# Patient Record
Sex: Female | Born: 1955 | Race: White | Hispanic: No | Smoking: Never smoker
Health system: Southern US, Community
[De-identification: ages and names within clinical notes are randomized; demographics above are authoritative.]

## PROBLEM LIST (undated history)

## (undated) DIAGNOSIS — M1990 Unspecified osteoarthritis, unspecified site: Secondary | ICD-10-CM

## (undated) DIAGNOSIS — L719 Rosacea, unspecified: Secondary | ICD-10-CM

## (undated) DIAGNOSIS — I10 Essential (primary) hypertension: Secondary | ICD-10-CM

## (undated) DIAGNOSIS — G4730 Sleep apnea, unspecified: Secondary | ICD-10-CM

## (undated) DIAGNOSIS — Z9889 Other specified postprocedural states: Secondary | ICD-10-CM

## (undated) DIAGNOSIS — R112 Nausea with vomiting, unspecified: Secondary | ICD-10-CM

## (undated) DIAGNOSIS — E669 Obesity, unspecified: Secondary | ICD-10-CM

## (undated) DIAGNOSIS — R000 Tachycardia, unspecified: Secondary | ICD-10-CM

## (undated) DIAGNOSIS — G4733 Obstructive sleep apnea (adult) (pediatric): Secondary | ICD-10-CM

## (undated) DIAGNOSIS — N950 Postmenopausal bleeding: Secondary | ICD-10-CM

## (undated) DIAGNOSIS — D649 Anemia, unspecified: Secondary | ICD-10-CM

## (undated) HISTORY — DX: Obstructive sleep apnea (adult) (pediatric): G47.33

## (undated) HISTORY — PX: COLONOSCOPY: SHX174

## (undated) HISTORY — PX: WISDOM TOOTH EXTRACTION: SHX21

## (undated) HISTORY — DX: Tachycardia, unspecified: R00.0

## (undated) HISTORY — DX: Postmenopausal bleeding: N95.0

## (undated) HISTORY — DX: Obesity, unspecified: E66.9

## (undated) HISTORY — PX: FOOT SURGERY: SHX648

## (undated) HISTORY — DX: Unspecified osteoarthritis, unspecified site: M19.90

---

## 2007-08-17 HISTORY — PX: CHOLECYSTECTOMY: SHX55

## 2012-06-02 ENCOUNTER — Other Ambulatory Visit (HOSPITAL_COMMUNITY)
Admission: RE | Admit: 2012-06-02 | Discharge: 2012-06-02 | Disposition: A | Discharge status: Home / Self Care | Payer: 59 | Source: Ambulatory Visit | Attending: Family Medicine | Admitting: Family Medicine

## 2012-06-02 DIAGNOSIS — Z124 Encounter for screening for malignant neoplasm of cervix: Secondary | ICD-10-CM | POA: Insufficient documentation

## 2012-06-02 DIAGNOSIS — Z1151 Encounter for screening for human papillomavirus (HPV): Secondary | ICD-10-CM | POA: Insufficient documentation

## 2012-06-19 ENCOUNTER — Other Ambulatory Visit: Payer: Self-pay | Admitting: Family Medicine

## 2012-06-19 DIAGNOSIS — Z1231 Encounter for screening mammogram for malignant neoplasm of breast: Secondary | ICD-10-CM

## 2012-07-24 ENCOUNTER — Ambulatory Visit
Admission: RE | Admit: 2012-07-24 | Discharge: 2012-07-24 | Disposition: A | Discharge status: Home / Self Care | Payer: 59 | Source: Ambulatory Visit | Attending: Family Medicine | Admitting: Family Medicine

## 2012-07-24 DIAGNOSIS — Z1231 Encounter for screening mammogram for malignant neoplasm of breast: Secondary | ICD-10-CM

## 2013-02-28 ENCOUNTER — Encounter (HOSPITAL_COMMUNITY): Payer: Self-pay | Admitting: Pharmacist

## 2013-03-05 ENCOUNTER — Encounter (HOSPITAL_COMMUNITY)
Admission: RE | Admit: 2013-03-05 | Discharge: 2013-03-05 | Disposition: A | Discharge status: Home / Self Care | Payer: BC Managed Care – PPO | Source: Ambulatory Visit | Attending: Obstetrics and Gynecology | Admitting: Obstetrics and Gynecology

## 2013-03-05 ENCOUNTER — Encounter (HOSPITAL_COMMUNITY): Payer: Self-pay

## 2013-03-05 ENCOUNTER — Ambulatory Visit (HOSPITAL_COMMUNITY)
Admission: RE | Admit: 2013-03-05 | Discharge: 2013-03-08 | Disposition: A | Discharge status: Home / Self Care | Payer: BC Managed Care – PPO | Source: Ambulatory Visit | Attending: Obstetrics and Gynecology | Admitting: Obstetrics and Gynecology

## 2013-03-05 DIAGNOSIS — D250 Submucous leiomyoma of uterus: Secondary | ICD-10-CM | POA: Insufficient documentation

## 2013-03-05 DIAGNOSIS — N840 Polyp of corpus uteri: Secondary | ICD-10-CM | POA: Insufficient documentation

## 2013-03-05 DIAGNOSIS — N950 Postmenopausal bleeding: Secondary | ICD-10-CM

## 2013-03-05 HISTORY — DX: Anemia, unspecified: D64.9

## 2013-03-05 HISTORY — DX: Rosacea, unspecified: L71.9

## 2013-03-05 HISTORY — DX: Essential (primary) hypertension: I10

## 2013-03-05 HISTORY — DX: Other specified postprocedural states: Z98.890

## 2013-03-05 HISTORY — DX: Nausea with vomiting, unspecified: R11.2

## 2013-03-05 LAB — BASIC METABOLIC PANEL
Chloride: 99 mEq/L (ref 96–112)
GFR calc non Af Amer: >90 mL/min (ref >90)
Glucose, Bld: 150 mg/dL — ABNORMAL HIGH (ref 70–99)
Potassium: 3.5 mEq/L (ref 3.5–5.1)
Sodium: 137 mEq/L (ref 135–145)

## 2013-03-05 LAB — CBC
Hemoglobin: 13.9 g/dL (ref 12.0–15.0)
MCHC: 34.9 g/dL (ref 30.0–36.0)
RBC: 4.27 MIL/uL (ref 3.87–5.11)
WBC: 5.8 10*3/uL (ref 4.0–10.5)

## 2013-03-05 NOTE — Patient Instructions (Addendum)
Your procedure is scheduled on:03/08/13  Enter through the Main Entrance at :1:15 pm Pick up desk phone and dial [phone] and inform us of your arrival.  Please call [phone] if you have any problems the morning of surgery.  Remember: Do not eat food after midnight:WED Clear liquids are ok until:1030am Thursday   You may brush your teeth the morning of surgery.  Take these meds the morning of surgery with a sip of water: BP med  DO NOT wear jewelry, eye make-up, lipstick,body lotion, or dark fingernail polish.  (Polished toes are ok) You may wear deodorant.  If you are to be admitted after surgery, leave suitcase in car until your room has been assigned. Patients discharged on the day of surgery will not be allowed to drive home. Wear loose fitting, comfortable clothes for your ride home.

## 2013-03-07 ENCOUNTER — Other Ambulatory Visit: Payer: Self-pay | Admitting: Obstetrics and Gynecology

## 2013-03-08 ENCOUNTER — Encounter (HOSPITAL_COMMUNITY): Payer: Self-pay | Admitting: *Deleted

## 2013-03-08 ENCOUNTER — Encounter (HOSPITAL_COMMUNITY): Payer: Self-pay | Admitting: Anesthesiology

## 2013-03-08 ENCOUNTER — Ambulatory Visit (HOSPITAL_COMMUNITY): Payer: BC Managed Care – PPO | Admitting: Anesthesiology

## 2013-03-08 ENCOUNTER — Encounter (HOSPITAL_COMMUNITY)
Admission: RE | Disposition: A | Discharge status: Home / Self Care | Payer: Self-pay | Source: Ambulatory Visit | Attending: Obstetrics and Gynecology

## 2013-03-08 DIAGNOSIS — N950 Postmenopausal bleeding: Secondary | ICD-10-CM | POA: Insufficient documentation

## 2013-03-08 DIAGNOSIS — Z8742 Personal history of other diseases of the female genital tract: Secondary | ICD-10-CM | POA: Insufficient documentation

## 2013-03-08 HISTORY — PX: HYSTEROSCOPY WITH D & C: SHX1775

## 2013-03-08 SURGERY — DILATATION AND CURETTAGE /HYSTEROSCOPY
Anesthesia: Monitor Anesthesia Care | Site: Uterus | Wound class: Clean Contaminated

## 2013-03-08 MED ORDER — PROPOFOL 10 MG/ML IV EMUL
INTRAVENOUS | Status: AC
  Filled 2013-03-08: qty 40

## 2013-03-08 MED ORDER — FENTANYL CITRATE 0.05 MG/ML IJ SOLN
INTRAMUSCULAR | Status: AC
  Filled 2013-03-08: qty 2

## 2013-03-08 MED ORDER — IBUPROFEN 200 MG PO TABS
600.0000 mg | ORAL_TABLET | Freq: Four times a day (QID) | ORAL | Status: DC | PRN
Start: 2013-03-08 — End: 2017-09-08

## 2013-03-08 MED ORDER — PROPOFOL 10 MG/ML IV BOLUS
INTRAVENOUS | Status: DC | PRN
  Administered 2013-03-08 (×4): 25 mg via INTRAVENOUS
  Administered 2013-03-08: 100 mg via INTRAVENOUS
  Administered 2013-03-08 (×2): 25 mg via INTRAVENOUS

## 2013-03-08 MED ORDER — LACTATED RINGERS IV SOLN
INTRAVENOUS | Status: DC
  Administered 2013-03-08: 15:00:00 via INTRAVENOUS

## 2013-03-08 MED ORDER — ONDANSETRON HCL 4 MG/2ML IJ SOLN
INTRAMUSCULAR | Status: AC
  Filled 2013-03-08: qty 2

## 2013-03-08 MED ORDER — ONDANSETRON HCL 4 MG/2ML IJ SOLN
INTRAMUSCULAR | Status: DC | PRN
  Administered 2013-03-08: 4 mg via INTRAVENOUS

## 2013-03-08 MED ORDER — BUPIVACAINE HCL (PF) 0.25 % IJ SOLN
INTRAMUSCULAR | Status: AC
  Filled 2013-03-08: qty 30

## 2013-03-08 MED ORDER — FENTANYL CITRATE 0.05 MG/ML IJ SOLN
INTRAMUSCULAR | Status: DC | PRN
  Administered 2013-03-08: 100 ug via INTRAVENOUS

## 2013-03-08 MED ORDER — MEPERIDINE HCL 25 MG/ML IJ SOLN: 6.2500 mg | INTRAMUSCULAR | Status: DC | PRN

## 2013-03-08 MED ORDER — SILVER NITRATE-POT NITRATE 75-25 % EX MISC
CUTANEOUS | Status: AC
  Filled 2013-03-08: qty 1

## 2013-03-08 MED ORDER — MIDAZOLAM HCL 5 MG/5ML IJ SOLN
INTRAMUSCULAR | Status: DC | PRN
  Administered 2013-03-08: 2 mg via INTRAVENOUS

## 2013-03-08 MED ORDER — DEXAMETHASONE SODIUM PHOSPHATE 10 MG/ML IJ SOLN
INTRAMUSCULAR | Status: DC | PRN
  Administered 2013-03-08: 10 mg via INTRAVENOUS

## 2013-03-08 MED ORDER — MIDAZOLAM HCL 2 MG/2ML IJ SOLN
INTRAMUSCULAR | Status: AC
  Filled 2013-03-08: qty 2

## 2013-03-08 MED ORDER — DEXAMETHASONE SODIUM PHOSPHATE 10 MG/ML IJ SOLN
INTRAMUSCULAR | Status: AC
  Filled 2013-03-08: qty 1

## 2013-03-08 MED ORDER — FENTANYL CITRATE 0.05 MG/ML IJ SOLN: 25.0000 ug | INTRAMUSCULAR | Status: DC | PRN

## 2013-03-08 MED ORDER — BUPIVACAINE HCL (PF) 0.25 % IJ SOLN
INTRAMUSCULAR | Status: DC | PRN
  Administered 2013-03-08: 20 mL

## 2013-03-08 MED ORDER — LACTATED RINGERS IV SOLN
INTRAVENOUS | Status: DC
  Administered 2013-03-08 (×2): via INTRAVENOUS

## 2013-03-08 MED ORDER — KETOROLAC TROMETHAMINE 30 MG/ML IJ SOLN
INTRAMUSCULAR | Status: DC | PRN
  Administered 2013-03-08: 30 mg via INTRAVENOUS

## 2013-03-08 MED ORDER — LIDOCAINE HCL (CARDIAC) 20 MG/ML IV SOLN
INTRAVENOUS | Status: DC | PRN
  Administered 2013-03-08: 50 mg via INTRAVENOUS

## 2013-03-08 MED ORDER — GLYCINE 1.5 % IR SOLN
Status: DC | PRN
  Administered 2013-03-08: 3000 mL

## 2013-03-08 MED ORDER — SILVER NITRATE-POT NITRATE 75-25 % EX MISC
CUTANEOUS | Status: DC | PRN
  Administered 2013-03-08: 2

## 2013-03-08 MED ORDER — DOXYCYCLINE HYCLATE 50 MG PO CAPS: 100.0000 mg | ORAL_CAPSULE | Freq: Every day | ORAL | Status: DC

## 2013-03-08 MED ORDER — LIDOCAINE HCL (CARDIAC) 20 MG/ML IV SOLN
INTRAVENOUS | Status: AC
  Filled 2013-03-08: qty 5

## 2013-03-08 MED ORDER — METOCLOPRAMIDE HCL 5 MG/ML IJ SOLN: 10.0000 mg | Freq: Once | INTRAMUSCULAR | Status: DC | PRN

## 2013-03-08 SURGICAL SUPPLY — 17 items
CANISTER SUCTION 2500CC (MISCELLANEOUS) ×2 IMPLANT
CATH ROBINSON RED A/P 16FR (CATHETERS) ×2 IMPLANT
CLOTH BEACON ORANGE TIMEOUT ST (SAFETY) ×2 IMPLANT
CONTAINER PREFILL 10% NBF 60ML (FORM) ×4 IMPLANT
DRESSING TELFA 8X3 (GAUZE/BANDAGES/DRESSINGS) ×2 IMPLANT
ELECT REM PT RETURN 9FT ADLT (ELECTROSURGICAL) ×2
ELECTRODE REM PT RTRN 9FT ADLT (ELECTROSURGICAL) ×1 IMPLANT
GLOVE BIOGEL M 6.5 STRL (GLOVE) ×4 IMPLANT
GLOVE BIOGEL PI IND STRL 6.5 (GLOVE) ×1 IMPLANT
GLOVE BIOGEL PI INDICATOR 6.5 (GLOVE) ×1
GOWN PREVENTION PLUS XLARGE (GOWN DISPOSABLE) ×2 IMPLANT
GOWN STRL REIN XL XLG (GOWN DISPOSABLE) ×4 IMPLANT
LOOP ANGLED CUTTING 22FR (CUTTING LOOP) IMPLANT
PACK HYSTEROSCOPY LF (CUSTOM PROCEDURE TRAY) ×2 IMPLANT
PAD OB MATERNITY 4.3X12.25 (PERSONAL CARE ITEMS) ×2 IMPLANT
TOWEL OR 17X24 6PK STRL BLUE (TOWEL DISPOSABLE) ×4 IMPLANT
WATER STERILE IRR 1000ML POUR (IV SOLUTION) ×2 IMPLANT

## 2013-03-08 NOTE — Anesthesia Postprocedure Evaluation (Signed)
  Anesthesia Post-op Note  Patient: Kristen Velez  Procedure(s) Performed: Procedure(s): DILATATION AND CURETTAGE /HYSTEROSCOPY (N/A)  Patient Location: PACU  Anesthesia Type:General  Level of Consciousness: awake, alert  and oriented  Airway and Oxygen Therapy: Patient Spontanous Breathing  Post-op Pain: none  Post-op Assessment: Post-op Vital signs reviewed, Patient's Cardiovascular Status Stable, Respiratory Function Stable, Patent Airway, No signs of Nausea or vomiting and Pain level controlled  Post-op Vital Signs: Reviewed and stable  Complications: No apparent anesthesia complications

## 2013-03-08 NOTE — Transfer of Care (Signed)
Immediate Anesthesia Transfer of Care Note  Patient: Kristen Velez  Procedure(s) Performed: Procedure(s): DILATATION AND CURETTAGE /HYSTEROSCOPY (N/A)  Patient Location: PACU  Anesthesia Type:General  Level of Consciousness: awake, alert , oriented and patient cooperative  Airway & Oxygen Therapy: Patient Spontanous Breathing and Patient connected to nasal cannula oxygen  Post-op Assessment: Report given to PACU RN, Post -op Vital signs reviewed and stable and Patient moving all extremities  Post vital signs: Reviewed and stable  Complications: No apparent anesthesia complications

## 2013-03-08 NOTE — Anesthesia Preprocedure Evaluation (Signed)
Anesthesia Evaluation  Patient identified by MRN, date of birth, ID band Patient awake    Reviewed: Allergy & Precautions, H&P , NPO status , Patient's Chart, lab work & pertinent test results  History of Anesthesia Complications (+) PONV  Airway Mallampati: III TM Distance: >3 FB Neck ROM: full    Dental no notable dental hx. (+) Teeth Intact   Pulmonary  ?undiagnosed OSA Snore breath sounds clear to auscultation  Pulmonary exam normal       Cardiovascular hypertension, On Medications Rhythm:regular     Neuro/Psych negative neurological ROS  negative psych ROS   GI/Hepatic negative GI ROS, Neg liver ROS,   Endo/Other  Morbid obesity  Renal/GU negative Renal ROS  negative genitourinary   Musculoskeletal   Abdominal Normal abdominal exam  (+)   Peds  Hematology negative hematology ROS (+) anemia ,   Anesthesia Other Findings   Reproductive/Obstetrics                           Anesthesia Physical Anesthesia Plan  ASA: III  Anesthesia Plan: MAC   Post-op Pain Management:    Induction:   Airway Management Planned:   Additional Equipment:   Intra-op Plan:   Post-operative Plan:   Informed Consent: I have reviewed the patients History and Physical, chart, labs and discussed the procedure including the risks, benefits and alternatives for the proposed anesthesia with the patient or authorized representative who has indicated his/her understanding and acceptance.   Dental Advisory Given  Plan Discussed with: Anesthesiologist  Anesthesia Plan Comments:         Anesthesia Quick Evaluation

## 2013-03-08 NOTE — H&P (Signed)
Date of Initial H&P: 03/08/2013 History reviewed, patient examined, no change in status, stable for surgery.

## 2013-03-08 NOTE — H&P (Signed)
03/08/2013  3:53 PM  PATIENT:  Kristen Velez  57 y.o. female  PRE-OPERATIVE DIAGNOSIS:  PMB h/o endometrial hyperplasia   POST-OPERATIVE DIAGNOSIS:  PMB h/o endometrial hyperplasia   PROCEDURE:  Procedure(s): DILATATION AND CURETTAGE /HYSTEROSCOPY (N/A)  SURGEON:  Surgeon(s) and Role:    * Philbert Ocallaghan J. Richardson Dopp, MD - Primary  PHYSICIAN ASSISTANT: None    ASSISTANTS: none   ANESTHESIA:   general  EBL:  Total I/O In: -  Out: 80 [Urine:80]  BLOOD ADMINISTERED:none  DRAINS: none   LOCAL MEDICATIONS USED:  MARCAINE     SPECIMEN:  Source of Specimen:  endometrial currettings   DISPOSITION OF SPECIMEN:  PATHOLOGY  COUNTS:  YES  TOURNIQUET:  * No tourniquets in log *  DICTATION: .Dragon Dictation  PLAN OF CARE: Discharge to home after PACU  PATIENT DISPOSITION:  PACU - hemodynamically stable.   Delay start of Pharmacological VTE agent (>24hrs) due to surgical blood loss or risk of bleeding: not applicable  Findings:  Slightly proliferative endometrium with 1 polyp noted..  Submucosal fibroid noted  At the fundus   Procedure: Patient was taken to the operating room where she was placed under general anesthesia. She was placed in the dorsal lithotomy position. She was prepped and draped in the usual sterile fashion. A speculum was placed into the vaginal vault. The anterior lip of the cervix was grasped with a single-tooth tenaculum. Quarter percent Marcaine was injected at the 4 and 8:00 positions of the cervix. The cervix was then sounded to 10  cm. The cervix was dilated to approximately 6 mm. Diagnostic hysteroscope was inserted. The findings noted above. The hysteroscope was removed. Sharp curet was introduced and endometrial corrected curettings were obtained. The hysteroscope was then reinserted. There was no evidence of endometrial polyps  with reinsertion of the hysteroscope. There was no evidence of perforation. Hysteroscope was then removed. The single-tooth tenaculum was  removed from the anterior lip of the cervix. Patient was noted to have bleeding from the tenaculum site. Silver nitrate was applied and excellent hemostasis was noted. The speculum was removed from the patient's vagina. She was awakened from anesthesia taken care at country room awake and in stable condition. Sponge lap and needle counts were correct x2.

## 2013-03-09 ENCOUNTER — Encounter (HOSPITAL_COMMUNITY): Payer: Self-pay | Admitting: Obstetrics and Gynecology

## 2013-03-12 LAB — GLUCOSE, CAPILLARY: Glucose-Capillary: 99 mg/dL (ref 70–99)

## 2013-04-17 NOTE — Op Note (Signed)
/  2014  3:53 PM  PATIENT: Kristen Velez 57 y.o. female  PRE-OPERATIVE DIAGNOSIS: PMB h/o endometrial hyperplasia  POST-OPERATIVE DIAGNOSIS: PMB h/o endometrial hyperplasia  PROCEDURE: Procedure(s):  DILATATION AND CURETTAGE /HYSTEROSCOPY (N/A)  SURGEON: Surgeon(s) and Role:  * Cailee Blanke J. Richardson Dopp, MD - Primary  PHYSICIAN ASSISTANT: None  ASSISTANTS: none  ANESTHESIA: general  EBL: Total I/O  In: -  Out: 80 [Urine:80]  BLOOD ADMINISTERED:none  DRAINS: none  LOCAL MEDICATIONS USED: MARCAINE  SPECIMEN: Source of Specimen: endometrial currettings  DISPOSITION OF SPECIMEN: PATHOLOGY  COUNTS: YES  TOURNIQUET: * No tourniquets in log *  DICTATION: .Dragon Dictation  PLAN OF CARE: Discharge to home after PACU  PATIENT DISPOSITION: PACU - hemodynamically stable.  Delay start of Pharmacological VTE agent (>24hrs) due to surgical blood loss or risk of bleeding: not applicable  Findings: Slightly proliferative endometrium with 1 polyp noted.. Submucosal fibroid noted At the fundus  Procedure: Patient was taken to the operating room where she was placed under general anesthesia. She was placed in the dorsal lithotomy position. She was prepped and draped in the usual sterile fashion. A speculum was placed into the vaginal vault. The anterior lip of the cervix was grasped with a single-tooth tenaculum. Quarter percent Marcaine was injected at the 4 and 8:00 positions of the cervix. The cervix was then sounded to 10 cm. The cervix was dilated to approximately 6 mm. Diagnostic hysteroscope was inserted. The findings noted above. The hysteroscope was removed. Sharp curet was introduced and endometrial corrected curettings were obtained. The hysteroscope was then reinserted. There was no evidence of endometrial polyps with reinsertion of the hysteroscope. There was no evidence of perforation. Hysteroscope was then removed. The single-tooth tenaculum was removed from the anterior lip of the cervix. Patient was noted  to have bleeding from the tenaculum site. Silver nitrate was applied and excellent hemostasis was noted. The speculum was removed from the patient's vagina. She was awakened from anesthesia taken care at country room awake and in stable condition. Sponge lap and needle counts were correct x2.

## 2013-06-20 ENCOUNTER — Other Ambulatory Visit: Payer: Self-pay | Admitting: Obstetrics and Gynecology

## 2013-06-28 ENCOUNTER — Other Ambulatory Visit: Payer: Self-pay

## 2013-06-28 DIAGNOSIS — Z1231 Encounter for screening mammogram for malignant neoplasm of breast: Secondary | ICD-10-CM

## 2013-07-30 ENCOUNTER — Ambulatory Visit
Admission: RE | Admit: 2013-07-30 | Discharge: 2013-07-30 | Disposition: A | Discharge status: Home / Self Care | Payer: Self-pay | Source: Ambulatory Visit

## 2013-07-30 DIAGNOSIS — Z1231 Encounter for screening mammogram for malignant neoplasm of breast: Secondary | ICD-10-CM

## 2014-01-09 ENCOUNTER — Other Ambulatory Visit: Payer: Self-pay | Admitting: Obstetrics and Gynecology

## 2014-01-09 ENCOUNTER — Other Ambulatory Visit (HOSPITAL_COMMUNITY)
Admission: RE | Admit: 2014-01-09 | Discharge: 2014-01-09 | Disposition: A | Discharge status: Home / Self Care | Payer: BC Managed Care – PPO | Source: Ambulatory Visit | Attending: Obstetrics and Gynecology | Admitting: Obstetrics and Gynecology

## 2014-01-09 DIAGNOSIS — Z01419 Encounter for gynecological examination (general) (routine) without abnormal findings: Secondary | ICD-10-CM | POA: Insufficient documentation

## 2014-10-23 ENCOUNTER — Other Ambulatory Visit: Payer: Self-pay

## 2014-10-23 DIAGNOSIS — Z1231 Encounter for screening mammogram for malignant neoplasm of breast: Secondary | ICD-10-CM

## 2014-10-30 ENCOUNTER — Ambulatory Visit
Admission: RE | Admit: 2014-10-30 | Discharge: 2014-10-30 | Disposition: A | Discharge status: Home / Self Care | Payer: BLUE CROSS/BLUE SHIELD | Source: Ambulatory Visit

## 2014-10-30 DIAGNOSIS — Z1231 Encounter for screening mammogram for malignant neoplasm of breast: Secondary | ICD-10-CM

## 2015-01-15 ENCOUNTER — Other Ambulatory Visit: Payer: Self-pay | Admitting: Obstetrics and Gynecology

## 2015-01-15 ENCOUNTER — Other Ambulatory Visit (HOSPITAL_COMMUNITY)
Admission: RE | Admit: 2015-01-15 | Discharge: 2015-01-15 | Disposition: A | Discharge status: Home / Self Care | Payer: BLUE CROSS/BLUE SHIELD | Source: Ambulatory Visit | Attending: Obstetrics and Gynecology | Admitting: Obstetrics and Gynecology

## 2015-01-15 DIAGNOSIS — Z1151 Encounter for screening for human papillomavirus (HPV): Secondary | ICD-10-CM | POA: Insufficient documentation

## 2015-01-15 DIAGNOSIS — Z01419 Encounter for gynecological examination (general) (routine) without abnormal findings: Secondary | ICD-10-CM | POA: Insufficient documentation

## 2015-01-16 LAB — CYTOLOGY - PAP

## 2015-12-05 ENCOUNTER — Other Ambulatory Visit: Payer: Self-pay

## 2015-12-05 DIAGNOSIS — Z1231 Encounter for screening mammogram for malignant neoplasm of breast: Secondary | ICD-10-CM

## 2015-12-12 ENCOUNTER — Ambulatory Visit: Payer: BLUE CROSS/BLUE SHIELD

## 2015-12-18 ENCOUNTER — Ambulatory Visit
Admission: RE | Admit: 2015-12-18 | Discharge: 2015-12-18 | Disposition: A | Discharge status: Home / Self Care | Payer: BLUE CROSS/BLUE SHIELD | Source: Ambulatory Visit

## 2015-12-18 DIAGNOSIS — Z1231 Encounter for screening mammogram for malignant neoplasm of breast: Secondary | ICD-10-CM

## 2016-01-05 DIAGNOSIS — I10 Essential (primary) hypertension: Secondary | ICD-10-CM | POA: Diagnosis not present

## 2016-01-05 DIAGNOSIS — I491 Atrial premature depolarization: Secondary | ICD-10-CM | POA: Diagnosis not present

## 2016-01-05 DIAGNOSIS — I499 Cardiac arrhythmia, unspecified: Secondary | ICD-10-CM | POA: Diagnosis not present

## 2016-01-15 DIAGNOSIS — J019 Acute sinusitis, unspecified: Secondary | ICD-10-CM | POA: Diagnosis not present

## 2016-03-12 DIAGNOSIS — L245 Irritant contact dermatitis due to other chemical products: Secondary | ICD-10-CM | POA: Diagnosis not present

## 2016-03-12 DIAGNOSIS — L821 Other seborrheic keratosis: Secondary | ICD-10-CM | POA: Diagnosis not present

## 2016-06-08 DIAGNOSIS — N950 Postmenopausal bleeding: Secondary | ICD-10-CM | POA: Diagnosis not present

## 2016-06-08 DIAGNOSIS — Z01419 Encounter for gynecological examination (general) (routine) without abnormal findings: Secondary | ICD-10-CM | POA: Diagnosis not present

## 2016-06-23 DIAGNOSIS — I10 Essential (primary) hypertension: Secondary | ICD-10-CM | POA: Diagnosis not present

## 2016-06-23 DIAGNOSIS — Z1211 Encounter for screening for malignant neoplasm of colon: Secondary | ICD-10-CM | POA: Diagnosis not present

## 2016-06-23 DIAGNOSIS — Z23 Encounter for immunization: Secondary | ICD-10-CM | POA: Diagnosis not present

## 2016-06-23 DIAGNOSIS — Z1159 Encounter for screening for other viral diseases: Secondary | ICD-10-CM | POA: Diagnosis not present

## 2016-06-29 ENCOUNTER — Other Ambulatory Visit: Payer: Self-pay | Admitting: Obstetrics and Gynecology

## 2016-06-29 DIAGNOSIS — D250 Submucous leiomyoma of uterus: Secondary | ICD-10-CM | POA: Diagnosis not present

## 2016-06-29 DIAGNOSIS — N950 Postmenopausal bleeding: Secondary | ICD-10-CM | POA: Diagnosis not present

## 2016-06-29 DIAGNOSIS — N840 Polyp of corpus uteri: Secondary | ICD-10-CM | POA: Diagnosis not present

## 2016-06-29 DIAGNOSIS — R938 Abnormal findings on diagnostic imaging of other specified body structures: Secondary | ICD-10-CM | POA: Diagnosis not present

## 2016-06-29 DIAGNOSIS — N8501 Benign endometrial hyperplasia: Secondary | ICD-10-CM | POA: Diagnosis not present

## 2016-07-14 DIAGNOSIS — N8501 Benign endometrial hyperplasia: Secondary | ICD-10-CM | POA: Diagnosis not present

## 2016-07-28 DIAGNOSIS — Z01818 Encounter for other preprocedural examination: Secondary | ICD-10-CM | POA: Diagnosis not present

## 2016-07-28 DIAGNOSIS — Z1211 Encounter for screening for malignant neoplasm of colon: Secondary | ICD-10-CM | POA: Diagnosis not present

## 2016-08-26 DIAGNOSIS — N950 Postmenopausal bleeding: Secondary | ICD-10-CM | POA: Diagnosis not present

## 2016-10-11 ENCOUNTER — Other Ambulatory Visit: Payer: Self-pay | Admitting: Obstetrics and Gynecology

## 2016-10-11 DIAGNOSIS — N8501 Benign endometrial hyperplasia: Secondary | ICD-10-CM | POA: Diagnosis not present

## 2016-12-20 DIAGNOSIS — K635 Polyp of colon: Secondary | ICD-10-CM | POA: Diagnosis not present

## 2016-12-20 DIAGNOSIS — Z1211 Encounter for screening for malignant neoplasm of colon: Secondary | ICD-10-CM | POA: Diagnosis not present

## 2016-12-20 DIAGNOSIS — K640 First degree hemorrhoids: Secondary | ICD-10-CM | POA: Diagnosis not present

## 2016-12-20 DIAGNOSIS — K5730 Diverticulosis of large intestine without perforation or abscess without bleeding: Secondary | ICD-10-CM | POA: Diagnosis not present

## 2016-12-23 DIAGNOSIS — K635 Polyp of colon: Secondary | ICD-10-CM | POA: Diagnosis not present

## 2016-12-27 DIAGNOSIS — D508 Other iron deficiency anemias: Secondary | ICD-10-CM | POA: Diagnosis not present

## 2016-12-27 DIAGNOSIS — M170 Bilateral primary osteoarthritis of knee: Secondary | ICD-10-CM | POA: Diagnosis not present

## 2016-12-27 DIAGNOSIS — I10 Essential (primary) hypertension: Secondary | ICD-10-CM | POA: Diagnosis not present

## 2016-12-27 DIAGNOSIS — G4719 Other hypersomnia: Secondary | ICD-10-CM | POA: Diagnosis not present

## 2017-02-08 DIAGNOSIS — R0681 Apnea, not elsewhere classified: Secondary | ICD-10-CM | POA: Diagnosis not present

## 2017-03-08 ENCOUNTER — Other Ambulatory Visit: Payer: Self-pay | Admitting: Family Medicine

## 2017-03-08 DIAGNOSIS — Z1231 Encounter for screening mammogram for malignant neoplasm of breast: Secondary | ICD-10-CM

## 2017-03-11 DIAGNOSIS — M1711 Unilateral primary osteoarthritis, right knee: Secondary | ICD-10-CM | POA: Diagnosis not present

## 2017-03-11 DIAGNOSIS — M1712 Unilateral primary osteoarthritis, left knee: Secondary | ICD-10-CM | POA: Diagnosis not present

## 2017-03-11 DIAGNOSIS — M170 Bilateral primary osteoarthritis of knee: Secondary | ICD-10-CM | POA: Diagnosis not present

## 2017-03-23 DIAGNOSIS — G4733 Obstructive sleep apnea (adult) (pediatric): Secondary | ICD-10-CM | POA: Diagnosis not present

## 2017-03-24 DIAGNOSIS — G4733 Obstructive sleep apnea (adult) (pediatric): Secondary | ICD-10-CM | POA: Diagnosis not present

## 2017-03-25 ENCOUNTER — Ambulatory Visit
Admission: RE | Admit: 2017-03-25 | Discharge: 2017-03-25 | Disposition: A | Discharge status: Home / Self Care | Payer: BLUE CROSS/BLUE SHIELD | Source: Ambulatory Visit | Attending: Family Medicine | Admitting: Family Medicine

## 2017-03-25 DIAGNOSIS — Z1231 Encounter for screening mammogram for malignant neoplasm of breast: Secondary | ICD-10-CM

## 2017-05-11 DIAGNOSIS — M1711 Unilateral primary osteoarthritis, right knee: Secondary | ICD-10-CM | POA: Diagnosis not present

## 2017-05-20 DIAGNOSIS — M170 Bilateral primary osteoarthritis of knee: Secondary | ICD-10-CM | POA: Diagnosis not present

## 2017-05-26 DIAGNOSIS — M170 Bilateral primary osteoarthritis of knee: Secondary | ICD-10-CM | POA: Diagnosis not present

## 2017-06-10 ENCOUNTER — Other Ambulatory Visit (HOSPITAL_COMMUNITY)
Admission: RE | Admit: 2017-06-10 | Discharge: 2017-06-10 | Disposition: A | Discharge status: Home / Self Care | Payer: BLUE CROSS/BLUE SHIELD | Source: Ambulatory Visit | Attending: Obstetrics and Gynecology | Admitting: Obstetrics and Gynecology

## 2017-06-10 ENCOUNTER — Other Ambulatory Visit: Payer: Self-pay | Admitting: Obstetrics and Gynecology

## 2017-06-10 DIAGNOSIS — Z01419 Encounter for gynecological examination (general) (routine) without abnormal findings: Secondary | ICD-10-CM | POA: Insufficient documentation

## 2017-06-10 DIAGNOSIS — N950 Postmenopausal bleeding: Secondary | ICD-10-CM | POA: Diagnosis not present

## 2017-06-10 DIAGNOSIS — Z8742 Personal history of other diseases of the female genital tract: Secondary | ICD-10-CM | POA: Diagnosis not present

## 2017-06-10 DIAGNOSIS — Z01411 Encounter for gynecological examination (general) (routine) with abnormal findings: Secondary | ICD-10-CM | POA: Diagnosis not present

## 2017-06-13 LAB — CYTOLOGY - PAP: DIAGNOSIS: NEGATIVE

## 2017-06-21 ENCOUNTER — Other Ambulatory Visit: Payer: Self-pay | Admitting: Obstetrics and Gynecology

## 2017-06-21 DIAGNOSIS — N858 Other specified noninflammatory disorders of uterus: Secondary | ICD-10-CM | POA: Diagnosis not present

## 2017-06-21 DIAGNOSIS — N950 Postmenopausal bleeding: Secondary | ICD-10-CM | POA: Diagnosis not present

## 2017-06-21 DIAGNOSIS — D250 Submucous leiomyoma of uterus: Secondary | ICD-10-CM | POA: Diagnosis not present

## 2017-07-11 DIAGNOSIS — N950 Postmenopausal bleeding: Secondary | ICD-10-CM | POA: Diagnosis not present

## 2017-07-11 DIAGNOSIS — R739 Hyperglycemia, unspecified: Secondary | ICD-10-CM | POA: Diagnosis not present

## 2017-07-11 DIAGNOSIS — Z23 Encounter for immunization: Secondary | ICD-10-CM | POA: Diagnosis not present

## 2017-07-11 DIAGNOSIS — I10 Essential (primary) hypertension: Secondary | ICD-10-CM | POA: Diagnosis not present

## 2017-08-22 DIAGNOSIS — N950 Postmenopausal bleeding: Secondary | ICD-10-CM | POA: Diagnosis not present

## 2017-08-26 NOTE — Patient Instructions (Addendum)
Your procedure is scheduled on:  Wednesday, Jan 23  Enter through the [employer] [facility] at: 9:45 am  Pick up the phone at the desk and dial [phone].  Call this number if you have problems the morning of surgery: [phone].  Remember: Do NOT eat or Do NOT drink clear liquids (including water) after midnight Tuesday  Take these medicines the morning of surgery with a SIP OF WATER:  None  Stop herbal medications and supplements at this time.  Do NOT wear jewelry (body piercing), metal hair clips/bobby pins, make-up, or nail polish. Do NOT wear lotions, powders, or perfumes.  You may wear deoderant. Do NOT shave for 48 hours prior to surgery. Do NOT bring valuables to the hospital. Contacts may not be worn into surgery.  Leave suitcase in car.  After surgery it may be brought to your room.  For patients admitted to the hospital, checkout time is 11:00 AM the day of discharge. Have a responsible adult drive you home and stay with you for 24 hours after your procedure.  Home with [facility] cell [phone].

## 2017-08-29 ENCOUNTER — Other Ambulatory Visit: Payer: Self-pay | Admitting: Obstetrics and Gynecology

## 2017-08-31 ENCOUNTER — Encounter (HOSPITAL_COMMUNITY): Payer: Self-pay

## 2017-08-31 ENCOUNTER — Other Ambulatory Visit: Payer: Self-pay

## 2017-08-31 ENCOUNTER — Encounter (HOSPITAL_COMMUNITY)
Admission: RE | Admit: 2017-08-31 | Discharge: 2017-08-31 | Disposition: A | Discharge status: Home / Self Care | Payer: BLUE CROSS/BLUE SHIELD | Source: Ambulatory Visit | Attending: Obstetrics and Gynecology | Admitting: Obstetrics and Gynecology

## 2017-08-31 DIAGNOSIS — I4510 Unspecified right bundle-branch block: Secondary | ICD-10-CM | POA: Diagnosis not present

## 2017-08-31 DIAGNOSIS — Z01810 Encounter for preprocedural cardiovascular examination: Secondary | ICD-10-CM | POA: Insufficient documentation

## 2017-08-31 DIAGNOSIS — D649 Anemia, unspecified: Secondary | ICD-10-CM | POA: Insufficient documentation

## 2017-08-31 DIAGNOSIS — Z01812 Encounter for preprocedural laboratory examination: Secondary | ICD-10-CM | POA: Insufficient documentation

## 2017-08-31 DIAGNOSIS — I10 Essential (primary) hypertension: Secondary | ICD-10-CM | POA: Diagnosis not present

## 2017-08-31 HISTORY — DX: Sleep apnea, unspecified: G47.30

## 2017-08-31 HISTORY — DX: Unspecified osteoarthritis, unspecified site: M19.90

## 2017-08-31 LAB — BASIC METABOLIC PANEL
Anion gap: 10 (ref 5–15)
BUN: 11 mg/dL (ref 6–20)
CO2: 25 mmol/L (ref 22–32)
CREATININE: 0.74 mg/dL (ref 0.44–1.00)
Calcium: 9.3 mg/dL (ref 8.9–10.3)
Chloride: 104 mmol/L (ref 101–111)
GFR calc Af Amer: >60 mL/min (ref >60)
GFR calc non Af Amer: >60 mL/min (ref >60)
GLUCOSE: 107 mg/dL — AB (ref 65–99)
Potassium: 4 mmol/L (ref 3.5–5.1)
Sodium: 139 mmol/L (ref 135–145)

## 2017-08-31 LAB — CBC
HCT: 42.5 % (ref 36.0–46.0)
HEMOGLOBIN: 14.7 g/dL (ref 12.0–15.0)
MCH: 32.9 pg (ref 26.0–34.0)
MCHC: 34.6 g/dL (ref 30.0–36.0)
MCV: 95.1 fL (ref 78.0–100.0)
Platelets: 264 10*3/uL (ref 150–400)
RBC: 4.47 MIL/uL (ref 3.87–5.11)
RDW: 12.1 % (ref 11.5–15.5)
WBC: 4.8 10*3/uL (ref 4.0–10.5)

## 2017-08-31 LAB — TYPE AND SCREEN
ABO/RH(D): A NEG
Antibody Screen: NEGATIVE

## 2017-08-31 LAB — ABO/RH: ABO/RH(D): A NEG

## 2017-08-31 NOTE — Pre-Procedure Instructions (Signed)
Reviewed medical history, medications and EKG with Dr. Tobias Alexander.  [facility] for surgery.  No orders given.

## 2017-09-05 ENCOUNTER — Telehealth: Payer: Self-pay

## 2017-09-05 NOTE — Telephone Encounter (Signed)
Referral sent to scheduling. 

## 2017-09-07 ENCOUNTER — Encounter (HOSPITAL_COMMUNITY): Payer: Self-pay

## 2017-09-07 ENCOUNTER — Other Ambulatory Visit: Payer: Self-pay | Admitting: Obstetrics and Gynecology

## 2017-09-07 ENCOUNTER — Other Ambulatory Visit: Payer: Self-pay

## 2017-09-07 ENCOUNTER — Encounter (HOSPITAL_COMMUNITY)
Admission: AD | Disposition: A | Discharge status: Home / Self Care | Payer: Self-pay | Source: Ambulatory Visit | Attending: Obstetrics and Gynecology

## 2017-09-07 ENCOUNTER — Ambulatory Visit (HOSPITAL_COMMUNITY): Payer: BLUE CROSS/BLUE SHIELD | Admitting: Certified Registered Nurse Anesthetist

## 2017-09-07 ENCOUNTER — Observation Stay (HOSPITAL_COMMUNITY)
Admission: AD | Admit: 2017-09-07 | Discharge: 2017-09-08 | Disposition: A | Discharge status: Home / Self Care | Payer: BLUE CROSS/BLUE SHIELD | Source: Ambulatory Visit | Attending: Obstetrics and Gynecology | Admitting: Obstetrics and Gynecology

## 2017-09-07 DIAGNOSIS — Z6841 Body Mass Index (BMI) 40.0 and over, adult: Secondary | ICD-10-CM | POA: Insufficient documentation

## 2017-09-07 DIAGNOSIS — Z7982 Long term (current) use of aspirin: Secondary | ICD-10-CM | POA: Diagnosis not present

## 2017-09-07 DIAGNOSIS — N879 Dysplasia of cervix uteri, unspecified: Secondary | ICD-10-CM | POA: Diagnosis not present

## 2017-09-07 DIAGNOSIS — G4733 Obstructive sleep apnea (adult) (pediatric): Secondary | ICD-10-CM | POA: Insufficient documentation

## 2017-09-07 DIAGNOSIS — M1990 Unspecified osteoarthritis, unspecified site: Secondary | ICD-10-CM | POA: Insufficient documentation

## 2017-09-07 DIAGNOSIS — D259 Leiomyoma of uterus, unspecified: Principal | ICD-10-CM | POA: Insufficient documentation

## 2017-09-07 DIAGNOSIS — I10 Essential (primary) hypertension: Secondary | ICD-10-CM | POA: Diagnosis not present

## 2017-09-07 DIAGNOSIS — Z880 Allergy status to penicillin: Secondary | ICD-10-CM | POA: Insufficient documentation

## 2017-09-07 DIAGNOSIS — Z90710 Acquired absence of both cervix and uterus: Secondary | ICD-10-CM | POA: Diagnosis present

## 2017-09-07 DIAGNOSIS — N800 Endometriosis of uterus: Secondary | ICD-10-CM | POA: Insufficient documentation

## 2017-09-07 DIAGNOSIS — D250 Submucous leiomyoma of uterus: Secondary | ICD-10-CM | POA: Diagnosis not present

## 2017-09-07 DIAGNOSIS — Z79899 Other long term (current) drug therapy: Secondary | ICD-10-CM | POA: Insufficient documentation

## 2017-09-07 DIAGNOSIS — N950 Postmenopausal bleeding: Secondary | ICD-10-CM | POA: Insufficient documentation

## 2017-09-07 DIAGNOSIS — D649 Anemia, unspecified: Secondary | ICD-10-CM | POA: Diagnosis not present

## 2017-09-07 HISTORY — PX: LAPAROSCOPIC VAGINAL HYSTERECTOMY WITH SALPINGO OOPHORECTOMY: SHX6681

## 2017-09-07 SURGERY — HYSTERECTOMY, VAGINAL, LAPAROSCOPY-ASSISTED, WITH SALPINGO-OOPHORECTOMY
Anesthesia: General | Site: Abdomen | Laterality: Bilateral

## 2017-09-07 MED ORDER — LIDOCAINE HCL (CARDIAC) 20 MG/ML IV SOLN
INTRAVENOUS | Status: DC | PRN
  Administered 2017-09-07: 50 mg via INTRAVENOUS

## 2017-09-07 MED ORDER — OXYCODONE-ACETAMINOPHEN 5-325 MG PO TABS: 1.0000 | ORAL_TABLET | ORAL | Status: DC | PRN

## 2017-09-07 MED ORDER — LIDOCAINE HCL (PF) 1 % IJ SOLN
INTRAMUSCULAR | Status: AC
  Filled 2017-09-07: qty 5

## 2017-09-07 MED ORDER — IRBESARTAN 150 MG PO TABS
150.0000 mg | ORAL_TABLET | Freq: Every day | ORAL | Status: DC
  Filled 2017-09-07 (×2): qty 1

## 2017-09-07 MED ORDER — SCOPOLAMINE 1 MG/3DAYS TD PT72
1.0000 | MEDICATED_PATCH | TRANSDERMAL | Status: DC
  Administered 2017-09-07: 1.5 mg via TRANSDERMAL

## 2017-09-07 MED ORDER — BUPIVACAINE HCL (PF) 0.25 % IJ SOLN
INTRAMUSCULAR | Status: AC
  Filled 2017-09-07: qty 30

## 2017-09-07 MED ORDER — KETOROLAC TROMETHAMINE 30 MG/ML IJ SOLN
30.0000 mg | Freq: Four times a day (QID) | INTRAMUSCULAR | Status: DC
  Administered 2017-09-07 – 2017-09-08 (×3): 30 mg via INTRAVENOUS
  Filled 2017-09-07 (×3): qty 1

## 2017-09-07 MED ORDER — HYDROMORPHONE HCL 1 MG/ML IJ SOLN: 0.2000 mg | INTRAMUSCULAR | Status: DC | PRN

## 2017-09-07 MED ORDER — LACTATED RINGERS IV SOLN
INTRAVENOUS | Status: DC
  Administered 2017-09-07 (×2): via INTRAVENOUS

## 2017-09-07 MED ORDER — DEXAMETHASONE SODIUM PHOSPHATE 10 MG/ML IJ SOLN
INTRAMUSCULAR | Status: DC | PRN
  Administered 2017-09-07: 4 mg via INTRAVENOUS

## 2017-09-07 MED ORDER — LIDOCAINE-EPINEPHRINE 1 %-1:100000 IJ SOLN
INTRAMUSCULAR | Status: AC
  Filled 2017-09-07: qty 1

## 2017-09-07 MED ORDER — GENTAMICIN SULFATE 40 MG/ML IJ SOLN
INTRAVENOUS | Status: AC
  Administered 2017-09-07: 115 mL via INTRAVENOUS
  Filled 2017-09-07: qty 9.75

## 2017-09-07 MED ORDER — OXYCODONE-ACETAMINOPHEN 5-325 MG PO TABS: 2.0000 | ORAL_TABLET | ORAL | Status: DC | PRN

## 2017-09-07 MED ORDER — MIDAZOLAM HCL 2 MG/2ML IJ SOLN
INTRAMUSCULAR | Status: DC | PRN
  Administered 2017-09-07: 2 mg via INTRAVENOUS

## 2017-09-07 MED ORDER — HYDROMORPHONE HCL 1 MG/ML IJ SOLN: 0.2500 mg | INTRAMUSCULAR | Status: DC | PRN

## 2017-09-07 MED ORDER — SCOPOLAMINE 1 MG/3DAYS TD PT72
MEDICATED_PATCH | TRANSDERMAL | Status: AC
  Administered 2017-09-07: 1.5 mg via TRANSDERMAL
  Filled 2017-09-07: qty 1

## 2017-09-07 MED ORDER — METOCLOPRAMIDE HCL 5 MG/ML IJ SOLN: 10.0000 mg | Freq: Once | INTRAMUSCULAR | Status: DC | PRN

## 2017-09-07 MED ORDER — KETOROLAC TROMETHAMINE 30 MG/ML IJ SOLN
INTRAMUSCULAR | Status: AC
Start: 2017-09-07 — End: 2017-09-07
  Filled 2017-09-07: qty 1

## 2017-09-07 MED ORDER — MIDAZOLAM HCL 2 MG/2ML IJ SOLN
INTRAMUSCULAR | Status: AC
  Filled 2017-09-07: qty 2

## 2017-09-07 MED ORDER — KETOROLAC TROMETHAMINE 30 MG/ML IJ SOLN: 30.0000 mg | Freq: Four times a day (QID) | INTRAMUSCULAR | Status: DC

## 2017-09-07 MED ORDER — ALUM & MAG HYDROXIDE-SIMETH 200-200-20 MG/5ML PO SUSP: 30.0000 mL | ORAL | Status: DC | PRN

## 2017-09-07 MED ORDER — ONDANSETRON HCL 4 MG PO TABS: 4.0000 mg | ORAL_TABLET | Freq: Four times a day (QID) | ORAL | Status: DC | PRN

## 2017-09-07 MED ORDER — PROPOFOL 10 MG/ML IV BOLUS
INTRAVENOUS | Status: AC
  Filled 2017-09-07: qty 20

## 2017-09-07 MED ORDER — KETOROLAC TROMETHAMINE 30 MG/ML IJ SOLN
INTRAMUSCULAR | Status: DC | PRN
  Administered 2017-09-07: 30 mg via INTRAVENOUS

## 2017-09-07 MED ORDER — LACTATED RINGERS IV SOLN
INTRAVENOUS | Status: DC
  Administered 2017-09-07: 18:00:00 via INTRAVENOUS

## 2017-09-07 MED ORDER — PROPOFOL 500 MG/50ML IV EMUL
INTRAVENOUS | Status: AC
  Filled 2017-09-07: qty 50

## 2017-09-07 MED ORDER — ONDANSETRON HCL 4 MG/2ML IJ SOLN
INTRAMUSCULAR | Status: AC
  Filled 2017-09-07: qty 2

## 2017-09-07 MED ORDER — HYDROCHLOROTHIAZIDE 12.5 MG PO CAPS
12.5000 mg | ORAL_CAPSULE | Freq: Every day | ORAL | Status: DC
  Filled 2017-09-07 (×2): qty 1

## 2017-09-07 MED ORDER — LIDOCAINE-EPINEPHRINE 1 %-1:100000 IJ SOLN
INTRAMUSCULAR | Status: DC | PRN
  Administered 2017-09-07: 9 mL
  Administered 2017-09-07: 20 mL

## 2017-09-07 MED ORDER — CANDESARTAN CILEXETIL-HCTZ 16-12.5 MG PO TABS: 1.0000 | ORAL_TABLET | Freq: Every day | ORAL | Status: DC

## 2017-09-07 MED ORDER — SUGAMMADEX SODIUM 200 MG/2ML IV SOLN
INTRAVENOUS | Status: AC
  Filled 2017-09-07: qty 2

## 2017-09-07 MED ORDER — SIMETHICONE 80 MG PO CHEW: 80.0000 mg | CHEWABLE_TABLET | Freq: Four times a day (QID) | ORAL | Status: DC | PRN

## 2017-09-07 MED ORDER — FENTANYL CITRATE (PF) 100 MCG/2ML IJ SOLN
INTRAMUSCULAR | Status: DC | PRN
  Administered 2017-09-07: 50 ug via INTRAVENOUS
  Administered 2017-09-07: 100 ug via INTRAVENOUS
  Administered 2017-09-07 (×2): 50 ug via INTRAVENOUS

## 2017-09-07 MED ORDER — ONDANSETRON HCL 4 MG/2ML IJ SOLN
INTRAMUSCULAR | Status: DC | PRN
  Administered 2017-09-07: 4 mg via INTRAVENOUS

## 2017-09-07 MED ORDER — DEXAMETHASONE SODIUM PHOSPHATE 4 MG/ML IJ SOLN
INTRAMUSCULAR | Status: AC
  Filled 2017-09-07: qty 1

## 2017-09-07 MED ORDER — MENTHOL 3 MG MT LOZG: 1.0000 | LOZENGE | OROMUCOSAL | Status: DC | PRN

## 2017-09-07 MED ORDER — PROPOFOL 500 MG/50ML IV EMUL
INTRAVENOUS | Status: DC | PRN
  Administered 2017-09-07: 25 ug/kg/min via INTRAVENOUS

## 2017-09-07 MED ORDER — ROCURONIUM BROMIDE 100 MG/10ML IV SOLN
INTRAVENOUS | Status: DC | PRN
  Administered 2017-09-07: 10 mg via INTRAVENOUS
  Administered 2017-09-07: 50 mg via INTRAVENOUS

## 2017-09-07 MED ORDER — SUGAMMADEX SODIUM 200 MG/2ML IV SOLN
INTRAVENOUS | Status: DC | PRN
  Administered 2017-09-07: 200 mg via INTRAVENOUS

## 2017-09-07 MED ORDER — BUPIVACAINE HCL (PF) 0.25 % IJ SOLN
INTRAMUSCULAR | Status: DC | PRN
  Administered 2017-09-07: 10 mL

## 2017-09-07 MED ORDER — PROPOFOL 10 MG/ML IV BOLUS
INTRAVENOUS | Status: DC | PRN
  Administered 2017-09-07: 200 mg via INTRAVENOUS

## 2017-09-07 MED ORDER — SENNOSIDES-DOCUSATE SODIUM 8.6-50 MG PO TABS: 1.0000 | ORAL_TABLET | Freq: Every evening | ORAL | Status: DC | PRN

## 2017-09-07 MED ORDER — FENTANYL CITRATE (PF) 250 MCG/5ML IJ SOLN
INTRAMUSCULAR | Status: AC
Start: 2017-09-07 — End: 2017-09-07
  Filled 2017-09-07: qty 5

## 2017-09-07 MED ORDER — ONDANSETRON HCL 4 MG/2ML IJ SOLN
4.0000 mg | Freq: Four times a day (QID) | INTRAMUSCULAR | Status: DC | PRN
Start: 2017-09-07 — End: 2017-09-08

## 2017-09-07 MED ORDER — IBUPROFEN 800 MG PO TABS: 800.0000 mg | ORAL_TABLET | Freq: Three times a day (TID) | ORAL | Status: DC | PRN

## 2017-09-07 SURGICAL SUPPLY — 44 items
APPLICATOR ARISTA FLEXITIP XL (MISCELLANEOUS) ×2 IMPLANT
CONT PATH 16OZ SNAP LID 3702 (MISCELLANEOUS) ×2 IMPLANT
COVER BACK TABLE 60X90IN (DRAPES) ×2 IMPLANT
COVER MAYO STAND STRL (DRAPES) ×2 IMPLANT
DECANTER SPIKE VIAL GLASS SM (MISCELLANEOUS) ×2 IMPLANT
DERMABOND ADVANCED (GAUZE/BANDAGES/DRESSINGS) ×1
DERMABOND ADVANCED .7 DNX12 (GAUZE/BANDAGES/DRESSINGS) ×1 IMPLANT
DILATOR CANAL MILEX (MISCELLANEOUS) IMPLANT
DRSG OPSITE POSTOP 3X4 (GAUZE/BANDAGES/DRESSINGS) ×2 IMPLANT
DURAPREP 26ML APPLICATOR (WOUND CARE) ×2 IMPLANT
ELECT REM PT RETURN 9FT ADLT (ELECTROSURGICAL) ×2
ELECTRODE REM PT RTRN 9FT ADLT (ELECTROSURGICAL) ×1 IMPLANT
GLOVE BIOGEL M 6.5 STRL (GLOVE) ×4 IMPLANT
GLOVE BIOGEL PI IND STRL 6.5 (GLOVE) ×3 IMPLANT
GLOVE BIOGEL PI IND STRL 7.0 (GLOVE) ×2 IMPLANT
GLOVE BIOGEL PI INDICATOR 6.5 (GLOVE) ×3
GLOVE BIOGEL PI INDICATOR 7.0 (GLOVE) ×2
HEMOSTAT ARISTA ABSORB 3G PWDR (MISCELLANEOUS) ×2 IMPLANT
LEGGING LITHOTOMY PAIR STRL (DRAPES) ×2 IMPLANT
LIGASURE IMPACT 36 18CM CVD LR (INSTRUMENTS) ×2 IMPLANT
NS IRRIG 1000ML POUR BTL (IV SOLUTION) ×2 IMPLANT
PACK LAVH (CUSTOM PROCEDURE TRAY) ×2 IMPLANT
PACK ROBOTIC GOWN (GOWN DISPOSABLE) ×2 IMPLANT
PACK TRENDGUARD 450 HYBRID PRO (MISCELLANEOUS) IMPLANT
PACK TRENDGUARD 600 HYBRD PROC (MISCELLANEOUS) ×1 IMPLANT
POUCH LAPAROSCOPIC INSTRUMENT (MISCELLANEOUS) ×2 IMPLANT
PROTECTOR NERVE ULNAR (MISCELLANEOUS) ×4 IMPLANT
SET IRRIG TUBING LAPAROSCOPIC (IRRIGATION / IRRIGATOR) IMPLANT
SHEARS HARMONIC ACE PLUS 36CM (ENDOMECHANICALS) ×2 IMPLANT
SLEEVE XCEL OPT CAN 5 100 (ENDOMECHANICALS) ×4 IMPLANT
SOLUTION ELECTROLUBE (MISCELLANEOUS) ×2 IMPLANT
SUT VIC AB 0 CT1 27 (SUTURE) ×2
SUT VIC AB 0 CT1 27XCR 8 STRN (SUTURE) ×2 IMPLANT
SUT VIC AB 0 CT1 36 (SUTURE) ×4 IMPLANT
SUT VIC AB 0 CTX 36 (SUTURE)
SUT VIC AB 0 CTX36XBRD ANBCTRL (SUTURE) IMPLANT
SUT VIC AB 4-0 PS2 27 (SUTURE) ×2 IMPLANT
SUT VICRYL 1 TIES 12X18 (SUTURE) ×2 IMPLANT
TOWEL OR 17X24 6PK STRL BLUE (TOWEL DISPOSABLE) ×4 IMPLANT
TRAY FOLEY CATH SILVER 14FR (SET/KITS/TRAYS/PACK) ×2 IMPLANT
TRENDGUARD 450 HYBRID PRO PACK (MISCELLANEOUS)
TRENDGUARD 600 HYBRID PROC PK (MISCELLANEOUS) ×2
TROCAR XCEL NON-BLD 5MMX100MML (ENDOMECHANICALS) ×2 IMPLANT
WARMER LAPAROSCOPE (MISCELLANEOUS) ×2 IMPLANT

## 2017-09-07 NOTE — Anesthesia Preprocedure Evaluation (Addendum)
Anesthesia Evaluation  Patient identified by MRN, date of birth, ID band Patient awake    Reviewed: Allergy & Precautions, NPO status , Patient's Chart, lab work & pertinent test results  History of Anesthesia Complications (+) PONV and history of anesthetic complications  Airway Mallampati: III  TM Distance: >3 FB Neck ROM: Full    Dental  (+) Teeth Intact   Pulmonary sleep apnea ,    Pulmonary exam normal breath sounds clear to auscultation       Cardiovascular hypertension, Pt. on medications Normal cardiovascular exam Rhythm:Regular Rate:Normal  EKG 08/31/2016 NSR RBBB   Neuro/Psych negative neurological ROS  negative psych ROS   GI/Hepatic negative GI ROS, Neg liver ROS,   Endo/Other  Morbid obesity  Renal/GU negative Renal ROS  negative genitourinary   Musculoskeletal  (+) Arthritis , Osteoarthritis,    Abdominal (+) + obese,   Peds  Hematology  (+) anemia ,   Anesthesia Other Findings   Reproductive/Obstetrics Fibroid uterus                            Anesthesia Physical Anesthesia Plan  ASA: III  Anesthesia Plan: General   Post-op Pain Management:    Induction: Intravenous  PONV Risk Score and Plan: 4 or greater and Midazolam, Propofol infusion, Dexamethasone, Ondansetron, Scopolamine patch - Pre-op and Treatment may vary due to age or medical condition  Airway Management Planned: Oral ETT  Additional Equipment:   Intra-op Plan:   Post-operative Plan: Extubation in OR  Informed Consent: I have reviewed the patients History and Physical, chart, labs and discussed the procedure including the risks, benefits and alternatives for the proposed anesthesia with the patient or authorized representative who has indicated his/her understanding and acceptance.   Dental advisory given  Plan Discussed with: CRNA, Anesthesiologist and Surgeon  Anesthesia Plan Comments:          Anesthesia Quick Evaluation

## 2017-09-07 NOTE — Transfer of Care (Signed)
Immediate Anesthesia Transfer of Care Note  Patient: Kristen Velez  Procedure(s) Performed: LAPAROSCOPIC ASSISTED VAGINAL HYSTERECTOMY WITH SALPINGO OOPHORECTOMY (Bilateral Abdomen)  Patient Location: PACU  Anesthesia Type:General  Level of Consciousness: awake, alert  and oriented  Airway & Oxygen Therapy: Patient Spontanous Breathing and Patient connected to nasal cannula oxygen  Post-op Assessment: Report given to RN and Post -op Vital signs reviewed and stable  Post vital signs: Reviewed and stable  Last Vitals:  Vitals:   09/07/17 0951  BP: 134/74  Pulse: 72  Resp: 18  Temp: 36.6 C  SpO2: 98%    Last Pain:  Vitals:   09/07/17 0951  TempSrc: Oral  PainSc:       Patients Stated Pain Goal: 4 (91/66/06 0045)  Complications: No apparent anesthesia complications

## 2017-09-07 NOTE — Anesthesia Postprocedure Evaluation (Signed)
Anesthesia Post Note  Patient: Kristen Velez  Procedure(s) Performed: LAPAROSCOPIC ASSISTED VAGINAL HYSTERECTOMY WITH SALPINGO OOPHORECTOMY (Bilateral Abdomen)     Patient location during evaluation: PACU Anesthesia Type: General Level of consciousness: awake and alert Pain management: pain level controlled Vital Signs Assessment: post-procedure vital signs reviewed and stable Respiratory status: spontaneous breathing, nonlabored ventilation and respiratory function stable Cardiovascular status: blood pressure returned to baseline and stable Postop Assessment: no apparent nausea or vomiting Anesthetic complications: no    Last Vitals:  Vitals:   09/07/17 1500 09/07/17 1510  BP: 121/63   Pulse: (!) 52   Resp: 14   Temp:  37.3 C  SpO2: 99%     Last Pain:  Vitals:   09/07/17 1510  TempSrc: Oral  PainSc:    Pain Goal: Patients Stated Pain Goal: 4 (09/07/17 0950)               Daphna Lafuente A.

## 2017-09-07 NOTE — H&P (Deleted)
  The note originally documented on this encounter has been moved the the encounter in which it belongs.  

## 2017-09-07 NOTE — Anesthesia Procedure Notes (Signed)
Procedure Name: Intubation Date/Time: 09/07/2017 11:43 AM Performed by: Bufford Spikes, CRNA Pre-anesthesia Checklist: Patient identified, Emergency Drugs available, Suction available and Patient being monitored Patient Re-evaluated:Patient Re-evaluated prior to induction Oxygen Delivery Method: Circle system utilized Preoxygenation: Pre-oxygenation with 100% oxygen Induction Type: IV induction Ventilation: Mask ventilation without difficulty Laryngoscope Size: Miller and 2 Grade View: Grade II Tube type: Oral Tube size: 7.0 mm Number of attempts: 1 Airway Equipment and Method: Stylet Placement Confirmation: ETT inserted through vocal cords under direct vision,  positive ETCO2 and breath sounds checked- equal and bilateral Secured at: 21 cm Tube secured with: Tape Dental Injury: Teeth and Oropharynx as per pre-operative assessment

## 2017-09-07 NOTE — Op Note (Signed)
09/07/2017  2:29 PM  PATIENT:  Kristen Velez  62 y.o. female  PRE-OPERATIVE DIAGNOSIS:  D25.0 Fibroids N95.0 PMB  POST-OPERATIVE DIAGNOSIS:  Fibroids N95.0 PMB  PROCEDURE:  Procedure(s): LAPAROSCOPIC ASSISTED VAGINAL HYSTERECTOMY WITH SALPINGO OOPHORECTOMY (Bilateral)  SURGEON:  Surgeon(s) and Role:    Christophe Louis, MD - Primary    * Thurnell Lose, MD - Assisting  PHYSICIAN ASSISTANT:   ASSISTANTS: none   ANESTHESIA:   general  EBL:  50 mL   BLOOD ADMINISTERED:none  DRAINS: Urinary Catheter (Foley)   LOCAL MEDICATIONS USED:  MARCAINE    and LIDOCAINE   SPECIMEN:  Source of Specimen:  uterus cervix bilateral fallopian tubes and ovaries   DISPOSITION OF SPECIMEN:  PATHOLOGY  COUNTS:  YES  TOURNIQUET:  * No tourniquets in log *  DICTATION: .Dragon Dictation  PLAN OF CARE: Admit to inpatient   PATIENT DISPOSITION:  PACU - hemodynamically stable.   Delay start of Pharmacological VTE agent (>24hrs) due to surgical blood loss or risk of bleeding: not applicable  Findings:slightly enlarged uterus. Normal fallopian tubes and ovaries.. Normal appearing cervix   Procedure: the patient was taken to the operating room placed under general anesthesia. Prepped and draped in the normal sterile fashion. A foley catheter was placed. A uterine manipulator was placed. Attention was turned to the abdomen where the umbilicus was injected with 10 cc of marcaine. A 5 mm trocar was placed under direct visualization. Pneumoperitoneum was achieved with C02 gas... A 5 mm trocar was placed in the right and left lower quadrants. Each trocar site was injected with 10 cc of marcaine prior to trocar placement.  The left ureter was identified. The left infundibulopelvic ligament was cauterized and transected with the harmonic scalpel. Followed by the broad ligament and the round ligament. This was repeated on the right side.  Attention was then turned to the vagina. A weighted speculum was placed  in to the vagina and the cervix was grasped with a toothed tenaculum. The cervix was then injected circumferentially with 1% xylocaine with 1:100K of epinephrine. The cervix was then circumferentially incised with the bovie and the bladder was dissected off the pubovesical cervical fascia. The anterior cul-de-sac as entered sharply. The same procedure was performed posteriorly and the posterior cu-lde-sac was entered sharply without difficulty. A Heaney clamp was placed over the uterosacral ligaments bilaterally., These were transected and suture ligated with 0 vicryl. The cardinal ligaments were then grasped with the ligasure cauterized and transected.  The uterine arteries wereThen clamped with ligasure cauterized and , transected .Excellent hemostasis was visualized. The uterus cervix bilateral fallopian tubes and ovaries were then removed.  The vaginal cuff angles were closed with an angle suture of 0 vicryl and transfixed to the ipsilateral uterosacral ligaments. The remainder of the vaginal cuff was closed with 0 vicryl in a running locked fashion. All instruments were removed from the vagina. Attention was turned to the abdomen were pneumoperitoneum was reestablished. The pelvis was irrigated. Excellent hemostasis was noted. All trocars were removed under direct visualization . The pneumoperitoneum was released. The skin incisions were closed with 4-0 vicryl and dermabod.  the patient was taken to the recovery room awake and in stable condition.  Sponge lap and needle counts were correct times 2.

## 2017-09-07 NOTE — H&P (Signed)
Chief Complaint(s):   Postmenopausal Bleeding/ PreOp for 09/07/17   HPI:  General 62 y/o presents for preop history and physical exam in preparation for LAVH/BSO to manage postmenopausal bleeidng. See u/s uterus measures 10 cm x 6 cm x 5 cm. the endometrium is 1.04 cm. she is intersted in definitive therapy via hysterectomy as this is the third time she has had PMB . endometrial biopsy performed 06/2017 was benign. Current Medication: Taking  Candesartan Cilexetil-HCTZ 16-12.5 MG Tablet 1 tablet Orally Once a day, Notes: MILLER     Calcium 600 MG Tablet 1 tablet with meals Orally Once a day, Notes: OTC     Aspir-81(Aspirin) 81 MG Tablet Delayed Release 1 tablet Orally Once a day, Notes: OTC     Acetaminophen 650 MG Tablet Extended Release 3 tablets in am daily Orally , Notes: OTC     Eye Drops Relief(Tetrahydrozoline-Zn Sulfate) 0.05-0.25 % Solution Ophthalmic prn, Notes: OTC     MVI 1 tablet once a day, Notes: OTC     Medication List reviewed and reconciled with the patient   Medical History:  HTN     Rosacea     Postmen bleeding complex uterine hyperplasia 10/13, provera for 6 mos, cont 4/14, 7/14; D and C Evett Kassa 7/14     Colonoscopy in [place] around age 23; Colon 5/18 Edwards diverticulosis in sigmoid colon, non bleeding internal hemorrhoids, no true polyp, repeat 10 yrs     obstructive sleep apnea (HSAT 03/23/17 ESS 4, AHI 13/hr, O2 min 63%) - patient deciding on Rx 11/20/94     silicone injections bilateral knees 2018      Allergies/Intolerance:  Amoxicillin - rash   Gyn History:  Sexual activity not currently sexually active. Periods : postmenopausal. Last pap smear date 06/10/2017. Last mammogram date 03/25/17. Denies Abnormal pap smear. Denies STD. Menarche 44. GYN procedures EMB - 10/11/16 EMB - 06/29/16 EMB - 06/24/13 EMB - 03/09/13 EMB - 12/04/12 EMB - 06/07/12 .   OB History:  Number of pregnancies 2. Pregnancy # 1 live birth, vaginal delivery 04/1983. Pregnancy # 2 live  birth, vaginal delivery 07/1986.   Surgical History:  gallbladder     two foot surgeries     Hysteroscopy D&C 03/01/13     Hysteroscopy D&C Landry Mellow 04/17/13   Hospitalization:  child birth x 2   Family History:  Father: alive 50 yrs, heart problems maybe    Mother: alive 7 yrs, htn, diabetic, diagnosed with Diabetes, Hypertension    Brother 1: 57 yrs, htn, Diabetes, Hypertension    Sister 1: 25 yrs    Sister 2: 39 yrs    Children: two children no issues    1 brother(s) , 2 sister(s) . 2daughter(s) .    no known gyn cancers\nneg gi family hx.  Social History: General Tobacco use cigarettes: Never smoked, Tobacco history last updated 08/22/2017.  no EXPOSURE TO PASSIVE SMOKE.  Alcohol: yes, 2-3 glasses of wine per week.  Caffeine: yes, tea.  no Recreational drug use.  DIET: yes.  Exercise: yes.  Marital Status: single, Divorced, moved here from [place] in August, [place].  Children: 2, girls.  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   ROS: CONSTITUTIONAL No" options="no,yes" propid="91" itemid="193425" categoryid="10464" encounterid="[id number]"Chills No. No" options="no,yes" propid="91" itemid="172899" categoryid="10464" encounterid="[id number]"Fatigue No. No" options="no,yes" propid="91" itemid="10467" categoryid="10464" encounterid="[id number]"Fever No. No" options="no,yes" propid="91" itemid="193426" categoryid="10464" encounterid="[id number]"Night sweats No. No" options="no,yes" propid="91" itemid="444261" categoryid="10464" encounterid="[id number]"Recent travel outside [place] No. No" options="no,yes" propid="91" itemid="193427" categoryid="10464" encounterid="[id number]"Sweats No. No" options="no,yes" propid="91" itemid="194825" categoryid="10464" encounterid="[id number]"Weight change No.  OPHTHALMOLOGY no" options="no,yes" propid="91" itemid="12520" categoryid="12516" encounterid="[id number]"Blurring of vision no. no" options="no,yes" propid="91" itemid="193469" categoryid="12516"  encounterid="[id number]"Change in vision no. no" options="no,yes" propid="91" itemid="194379" categoryid="12516" encounterid="[id number]"Double vision no.  ENT no" options="no,yes" propid="91" itemid="193612" categoryid="10481" encounterid="[id number]"Dizziness no. Nose bleeds no. Sore throat no. Teeth pain no.  ALLERGY no" options="no,yes" propid="91" itemid="202589" categoryid="138152" encounterid="[id number]"Hives no.  CARDIOLOGY no" options="no,yes" propid="91" itemid="193603" categoryid="10488" encounterid="[id number]"Chest pain no. yes" options="no,yes" propid="91" itemid="199089" categoryid="10488" encounterid="[id number]"High blood pressure yes. no" options="no,yes" propid="91" itemid="202598" categoryid="10488" encounterid="[id number]"Irregular heart beat no. no" options="no,yes" propid="91" itemid="10491" categoryid="10488" encounterid="[id number]"Leg edema no. no" options="no,yes" propid="91" itemid="10490" categoryid="10488" encounterid="[id number]"Palpitations no.  RESPIRATORY no" options="no" propid="91" itemid="270013" categoryid="138132" encounterid="[id number]"Shortness of breath no. no" options="no,yes" propid="91" itemid="172745" categoryid="138132" encounterid="[id number]"Cough no. no" options="no,yes" propid="91" itemid="193621" categoryid="138132" encounterid="[id number]"Wheezing no.  UROLOGY no" options="no,yes" propid="91" [phone]" categoryid="138166" encounterid="[id number]"Pain with urination no. no" options="no,yes" propid="91" itemid="193493" categoryid="138166" encounterid="[id number]"Urinary urgency no. no" options="no,yes" propid="91" itemid="193492" categoryid="138166" encounterid="[id number]"Urinary frequency no. no" options="no,yes" propid="91" itemid="138171" categoryid="138166" encounterid="[id number]"Urinary incontinence no. No" options="no,yes" propid="91" itemid="138167" categoryid="138166" encounterid="[id number]"Difficulty urinating No. No" options="no,yes" propid="91" itemid="138168" categoryid="138166"  encounterid="[id number]"Blood in urine No.  GASTROENTEROLOGY no" options="no,yes" propid="91" itemid="10496" categoryid="10494" encounterid="[id number]"Abdominal pain no. no" options="no,yes" propid="91" itemid="193447" categoryid="10494" encounterid="[id number]"Appetite change no. no" options="no,yes" propid="91" itemid="193448" categoryid="10494" encounterid="[id number]"Bloating/belching no. no" options="no,yes" propid="91" itemid="10503" categoryid="10494" encounterid="[id number]"Blood in stool or on toilet paper no. no" options="no,yes" propid="91" itemid="199106" categoryid="10494" encounterid="[id number]"Change in bowel movements no. no" options="no,yes" propid="91" itemid="10501" categoryid="10494" encounterid="[id number]"Constipation no. no" options="no,yes" propid="91" itemid="10502" categoryid="10494" encounterid="[id number]"Diarrhea no. no" options="no,yes" propid="91" itemid="199104" categoryid="10494" encounterid="[id number]"Difficulty swallowing no. no" options="no,yes" propid="91" itemid="10499" categoryid="10494" encounterid="[id number]"Nausea no.  FEMALE REPRODUCTIVE no" options="no,yes" propid="91" itemid="453725" categoryid="10525" encounterid="[id number]"Vulvar pain no. no" options="no,yes" propid="91" itemid="453726" categoryid="10525" encounterid="[id number]"Vulvar rash no. yes" options="no, yes" propid="91" itemid="444315" categoryid="10525" encounterid="[id number]"Abnormal vaginal bleeding yes. no" options="no,yes" propid="91" itemid="186083" categoryid="10525" encounterid="[id number]"Breast pain no. no" options="no,yes" propid="91" itemid="186084" categoryid="10525" encounterid="[id number]"Nipple discharge no. no" options="no,yes" propid="91" itemid="275823" categoryid="10525" encounterid="[id number]"Pain with intercourse no. no" options="no,yes" propid="91" itemid="186082" categoryid="10525" encounterid="[id number]"Pelvic pain no. no" options="no,yes" propid="91" itemid="278230" categoryid="10525" encounterid="[id number]"Unusual vaginal  discharge no. no" options="no,yes" propid="91" itemid="278942" categoryid="10525" encounterid="[id number]"Vaginal itching no.  MUSCULOSKELETAL no" options="no,yes" propid="91" itemid="193461" categoryid="10514" encounterid="[id number]"Muscle aches no.  NEUROLOGY no" options="no,yes" propid="91" itemid="12513" categoryid="12512" encounterid="[id number]"Headache no. no" options="no,yes" propid="91" itemid="12514" categoryid="12512" encounterid="[id number]"Tingling/numbness no. no" options="no,yes" propid="91" itemid="193468" categoryid="12512" encounterid="[id number]"Weakness no.  PSYCHOLOGY no" options="" propid="91" itemid="275919" categoryid="10520" encounterid="[id number]"Depression no. no" options="no,yes" propid="91" itemid="172748" categoryid="10520" encounterid="[id number]"Anxiety no. no" options="no,yes" propid="91" itemid="199158" categoryid="10520" encounterid="[id number]"Nervousness no. no" options="no,yes" propid="91" itemid="12502" categoryid="10520" encounterid="[id number]"Sleep disturbances no. no " options="no,yes" propid="91" itemid="72718" categoryid="10520" encounterid="[id number]"Suicidal ideation no .  ENDOCRINOLOGY no" options="no,yes" propid="91" itemid="194628" categoryid="12508" encounterid="[id number]"Excessive thirst no. no" options="no,yes" propid="91" itemid="196285" categoryid="12508" encounterid="[id number]"Excessive urination no. no" options="no, yes" propid="91" itemid="444314" categoryid="12508" encounterid="[id number]"Hair loss no. no" options="" propid="91" itemid="447284" categoryid="12508" encounterid="[id number]"Heat or cold intolerance no.  HEMATOLOGY/LYMPH no" options="no,yes" propid="91" itemid="199152" categoryid="138157" encounterid="[id number]"Abnormal bleeding no. no" options="no,yes" propid="91" itemid="170653" categoryid="138157" encounterid="[id number]"Easy bruising no. no" options="no,yes" propid="91" itemid="138158" categoryid="138157" encounterid="[id number]"Swollen glands no.  DERMATOLOGY no"  options="no,yes" propid="91" itemid="199126" categoryid="12503" encounterid="[id number]"New/changing skin lesion no. no" options="no,yes" propid="91" itemid="12504" categoryid="12503" encounterid="[id number]"Rash no. no" options="" propid="91" itemid="444313" categoryid="12503" encounterid="[id number]"Sores no.   Negative except as stated in HPI.  Objective: Vitals: Wt 263, Wt change 3 lb, Ht 62, BMI 48.10, Pulse sitting 92, BP sitting 122/82  Past Results: Examination:  General Examination alert, oriented, NAD " categoryPropId="10089" examid="193638"GENERAL APPEARANCE alert, oriented, NAD .  moist, warm" categoryPropId="10109" examid="193638"SKIN: moist, warm.  Conjunctiva clear" categoryPropId="21468" examid="193638"EYES: Conjunctiva clear.  clear to auscultation bilaterally" categoryPropId="87" examid="193638"LUNGS: clear to auscultation bilaterally.  regular rate and rhythm" categoryPropId="86" examid="193638"HEART: regular rate and rhythm.  soft, non-tender/non-distended, bowel sounds present " categoryPropId="88" examid="193638"ABDOMEN: soft, non-tender/non-distended, bowel sounds present .  normal external genitalia, labia - unremarkable, vagina -  pink moist mucosa, no lesions or abnormal discharge, cervix - no discharge or lesions or CMT, adnexa - no masses or tenderness, uterus - nontender and normal size on palpation " categoryPropId="13414" examid="193638"FEMALE GENITOURINARY: normal external genitalia, labia - unremarkable, vagina - pink moist mucosa, no lesions or abnormal discharge, cervix - no discharge or lesions or CMT, adnexa - no masses or tenderness, uterus - nontender and normal size on palpation .  no edema present" categoryPropId="89" examid="193638"EXTREMITIES: no edema present.  affect normal, good eye contact" categoryPropId="16316" examid="193638"PSYCH: affect normal, good eye contact.  Physical Examination:    Assessment: Assessment:  Postmenopausal bleeding - N95.0  (Primary)     Plan: Treatment: Postmenopausal bleeding Notes: patient desires definitive therapy via LAVH/ BSO. r/b/a of hysteretomy discussed wi tht epatient including but not limited to infection/ bleeding / damage to bowel bladder ureters with the need for futher surgery. r/o transfusion discussed. pt voiced understanding and desries to proceed.Marland Kitchen

## 2017-09-08 ENCOUNTER — Encounter (HOSPITAL_COMMUNITY): Payer: Self-pay | Admitting: Obstetrics and Gynecology

## 2017-09-08 DIAGNOSIS — N800 Endometriosis of uterus: Secondary | ICD-10-CM | POA: Diagnosis not present

## 2017-09-08 DIAGNOSIS — N950 Postmenopausal bleeding: Secondary | ICD-10-CM

## 2017-09-08 DIAGNOSIS — Z79899 Other long term (current) drug therapy: Secondary | ICD-10-CM | POA: Diagnosis not present

## 2017-09-08 DIAGNOSIS — Z6841 Body Mass Index (BMI) 40.0 and over, adult: Secondary | ICD-10-CM | POA: Diagnosis not present

## 2017-09-08 DIAGNOSIS — M1990 Unspecified osteoarthritis, unspecified site: Secondary | ICD-10-CM | POA: Diagnosis not present

## 2017-09-08 DIAGNOSIS — I10 Essential (primary) hypertension: Secondary | ICD-10-CM | POA: Diagnosis not present

## 2017-09-08 DIAGNOSIS — D259 Leiomyoma of uterus, unspecified: Secondary | ICD-10-CM | POA: Diagnosis not present

## 2017-09-08 DIAGNOSIS — Z880 Allergy status to penicillin: Secondary | ICD-10-CM | POA: Diagnosis not present

## 2017-09-08 DIAGNOSIS — N879 Dysplasia of cervix uteri, unspecified: Secondary | ICD-10-CM | POA: Diagnosis not present

## 2017-09-08 DIAGNOSIS — Z7982 Long term (current) use of aspirin: Secondary | ICD-10-CM | POA: Diagnosis not present

## 2017-09-08 DIAGNOSIS — G4733 Obstructive sleep apnea (adult) (pediatric): Secondary | ICD-10-CM | POA: Diagnosis not present

## 2017-09-08 LAB — CBC
HCT: 36 % (ref 36.0–46.0)
HEMOGLOBIN: 12.2 g/dL (ref 12.0–15.0)
MCH: 32.7 pg (ref 26.0–34.0)
MCHC: 33.9 g/dL (ref 30.0–36.0)
MCV: 96.5 fL (ref 78.0–100.0)
Platelets: 219 10*3/uL (ref 150–400)
RBC: 3.73 MIL/uL — ABNORMAL LOW (ref 3.87–5.11)
RDW: 12.4 % (ref 11.5–15.5)
WBC: 9.2 10*3/uL (ref 4.0–10.5)

## 2017-09-08 MED ORDER — IBUPROFEN 800 MG PO TABS: 800.0000 mg | ORAL_TABLET | Freq: Three times a day (TID) | ORAL | 0 refills | Status: AC | PRN

## 2017-09-08 MED ORDER — OXYCODONE-ACETAMINOPHEN 5-325 MG PO TABS: 1.0000 | ORAL_TABLET | ORAL | 0 refills | Status: DC | PRN

## 2017-09-08 NOTE — Discharge Summary (Signed)
Physician Discharge Summary  Patient ID: Kristen Velez MRN: [id number] DOB/AGE: 1956-01-31 62 y.o.  Admit date: 09/07/2017 Discharge date: 09/08/2017  Admission Diagnoses: postmenopausal bleeding   Discharge Diagnoses:  Active Problems:   S/P laparoscopic assisted vaginal hysterectomy (LAVH)   Postmenopausal bleeding   Discharged Condition: good  Hospital Course: Pt admitted for observation after LAVH/ BSO. She did well postoperatively with return of bowel and bladder function.   Consults: None  Significant Diagnostic Studies: labs: hgb pod #1 12.2  Treatments: surgery: LAVH/BSO  Discharge Exam: Blood pressure (!) 114/54, pulse (!) 56, temperature 98.1 F (36.7 C), temperature source Oral, resp. rate 18, height 5\' 2"  (1.575 m), weight 117.9 kg (260 lb), SpO2 100 %. General appearance: alert, cooperative and no distress GI: soft appropriately tender nondistended + BS  Extremities: extremities normal, atraumatic, no cyanosis or edema Incision/Wound:well approximated wound edges. No erythema or exudate   Disposition: 01-Home or Self Care  Discharge Instructions     Remove dressing in 72 hours   Complete by:  As directed    Call MD for:  persistant nausea and vomiting   Complete by:  As directed    Call MD for:  redness, tenderness, or signs of infection (pain, swelling, redness, odor or green/yellow discharge around incision site)   Complete by:  As directed    Call MD for:  severe uncontrolled pain   Complete by:  As directed    Call MD for:  temperature >100.4   Complete by:  As directed    Diet - low sodium heart healthy   Complete by:  As directed    Driving Restrictions   Complete by:  As directed    Avoid driving for 1 week   Increase activity slowly   Complete by:  As directed    Lifting restrictions   Complete by:  As directed    Avoid lifting for 2 weeks   May shower / Bathe   Complete by:  As directed    May walk up steps   Complete by:  As directed     Sexual Activity Restrictions   Complete by:  As directed    Avoid sexual activity     Allergies as of 09/08/2017      Reactions   Amoxicillin Itching, Rash, Other (See Comments)   Has patient had a PCN reaction causing immediate rash, facial/tongue/throat swelling, SOB or lightheadedness with hypotension: No Has patient had a PCN reaction causing severe rash involving mucus membranes or skin necrosis: No Has patient had a PCN reaction that required hospitalization: No Has patient had a PCN reaction occurring within the last 10 years: No If all of the above answers are "NO", then may proceed with Cephalosporin use.      Medication List    STOP taking these medications   acetaminophen 650 MG CR tablet Commonly known as:  TYLENOL     TAKE these medications   aspirin 81 MG tablet Take 81 mg by mouth daily.   Calcium-Vitamin D3 600-400 MG-UNIT Tabs Take 1 tablet by mouth 2 (two) times daily.   candesartan-hydrochlorothiazide 16-12.5 MG tablet Commonly known as:  ATACAND HCT Take 1 tablet by mouth daily.   ibuprofen 800 MG tablet Commonly known as:  ADVIL,MOTRIN Take 1 tablet (800 mg total) by mouth every 8 (eight) hours as needed (mild pain). What changed:    medication strength  how much to take  when to take this  reasons to take this   multivitamin  with minerals Tabs tablet Take 1 tablet by mouth daily.   oxyCODONE-acetaminophen 5-325 MG tablet Commonly known as:  PERCOCET/ROXICET Take 1-2 tablets by mouth every 4 (four) hours as needed for severe pain ((when tolerating fluids)).   SYSTANE OP Place 2-3 drops into both eyes 2 (two) times daily as needed (for dry eyes).      Follow-up Information    Christophe Louis, MD Follow up in 2 week(s).   Specialty:  Obstetrics and Gynecology Why:  pt already has an appt in 2 weeks for postoperative visit  Contact information: [address] [place] [address] [phone]            Signed: Catha Brow. 09/08/2017, 7:58 AM

## 2017-09-08 NOTE — Progress Notes (Signed)
Discharge teaching complete with pt and sister. Pt and sister understood all instructions and did not have any questions. Pt walked out of the hospital and discharged home to family.

## 2017-09-08 NOTE — Plan of Care (Signed)
  Education: Knowledge of General Education information will improve 09/08/2017 0343 - Progressing by Lars Masson, RN   Clinical Measurements: Ability to maintain clinical measurements within normal limits will improve 09/08/2017 0343 - Progressing by Lars Masson, RN   Clinical Measurements: Will remain free from infection 09/08/2017 0343 - Progressing by Lars Masson, RN   Clinical Measurements: Respiratory complications will improve 09/08/2017 0343 - Progressing by Lars Masson, RN   Clinical Measurements: Cardiovascular complication will be avoided 09/08/2017 0343 - Progressing by Lars Masson, RN

## 2017-10-11 DIAGNOSIS — L718 Other rosacea: Secondary | ICD-10-CM | POA: Diagnosis not present

## 2017-10-11 DIAGNOSIS — C44319 Basal cell carcinoma of skin of other parts of face: Secondary | ICD-10-CM | POA: Diagnosis not present

## 2017-10-12 DIAGNOSIS — M9903 Segmental and somatic dysfunction of lumbar region: Secondary | ICD-10-CM | POA: Diagnosis not present

## 2017-10-12 DIAGNOSIS — M5416 Radiculopathy, lumbar region: Secondary | ICD-10-CM | POA: Diagnosis not present

## 2017-10-12 DIAGNOSIS — M9904 Segmental and somatic dysfunction of sacral region: Secondary | ICD-10-CM | POA: Diagnosis not present

## 2017-10-12 DIAGNOSIS — M545 Low back pain: Secondary | ICD-10-CM | POA: Diagnosis not present

## 2017-10-13 DIAGNOSIS — M9903 Segmental and somatic dysfunction of lumbar region: Secondary | ICD-10-CM | POA: Diagnosis not present

## 2017-10-13 DIAGNOSIS — M9904 Segmental and somatic dysfunction of sacral region: Secondary | ICD-10-CM | POA: Diagnosis not present

## 2017-10-13 DIAGNOSIS — M545 Low back pain: Secondary | ICD-10-CM | POA: Diagnosis not present

## 2017-10-13 DIAGNOSIS — M5416 Radiculopathy, lumbar region: Secondary | ICD-10-CM | POA: Diagnosis not present

## 2017-10-17 DIAGNOSIS — M5416 Radiculopathy, lumbar region: Secondary | ICD-10-CM | POA: Diagnosis not present

## 2017-10-17 DIAGNOSIS — M545 Low back pain: Secondary | ICD-10-CM | POA: Diagnosis not present

## 2017-10-17 DIAGNOSIS — M9903 Segmental and somatic dysfunction of lumbar region: Secondary | ICD-10-CM | POA: Diagnosis not present

## 2017-10-17 DIAGNOSIS — M9904 Segmental and somatic dysfunction of sacral region: Secondary | ICD-10-CM | POA: Diagnosis not present

## 2017-10-18 ENCOUNTER — Ambulatory Visit: Payer: BLUE CROSS/BLUE SHIELD | Admitting: Interventional Cardiology

## 2017-10-18 ENCOUNTER — Encounter (INDEPENDENT_AMBULATORY_CARE_PROVIDER_SITE_OTHER): Payer: Self-pay

## 2017-10-18 VITALS — BP 120/80 | HR 108 | Ht 62.0 in | Wt 260.6 lb

## 2017-10-18 DIAGNOSIS — M9903 Segmental and somatic dysfunction of lumbar region: Secondary | ICD-10-CM | POA: Diagnosis not present

## 2017-10-18 DIAGNOSIS — I10 Essential (primary) hypertension: Secondary | ICD-10-CM

## 2017-10-18 DIAGNOSIS — M545 Low back pain: Secondary | ICD-10-CM | POA: Diagnosis not present

## 2017-10-18 DIAGNOSIS — M9904 Segmental and somatic dysfunction of sacral region: Secondary | ICD-10-CM | POA: Diagnosis not present

## 2017-10-18 DIAGNOSIS — M5416 Radiculopathy, lumbar region: Secondary | ICD-10-CM | POA: Diagnosis not present

## 2017-10-18 DIAGNOSIS — I4510 Unspecified right bundle-branch block: Secondary | ICD-10-CM

## 2017-10-18 DIAGNOSIS — R9431 Abnormal electrocardiogram [ECG] [EKG]: Secondary | ICD-10-CM

## 2017-10-18 NOTE — Progress Notes (Signed)
Cardiology Office Note   Date:  10/18/2017   ID:  Kristen Velez, DOB 09/02/1955, MRN [id number]  PCP:  Kathyrn Lass, MD    No chief complaint on file. abnormal ECG/RBBB   Wt Readings from Last 3 Encounters:  10/18/17 260 lb 9.6 oz (118.2 kg)  09/07/17 260 lb (117.9 kg)  08/31/17 264 lb 6.4 oz (119.9 kg)       History of Present Illness: Kristen Velez is a 62 y.o. female who is being seen today for the evaluation of abnormal ECG at the request of Kathyrn Lass, MD.    She was evaluated for hysterectomy and ECG showed new RBBB.  SHe had surgery and there were no cardiac issues.  She feels palpitations with stress or excess caffeine.  She is not very active.  Walking to work is the most strenuous activity.  Knee pain limits.    Denies : Chest pain. Dizziness. Leg edema. Nitroglycerin use. Orthopnea. Paroxysmal nocturnal dyspnea. Shortness of breath. Syncope.   She works at [employer], sedentary.   No prior cardiac testing.   Parents have pacemakers, both alive at 27.        Past Medical History:  Diagnosis Date  . Anemia   . Arthritis    knees and feet  . Hypertension   . Obesity   . OSA (obstructive sleep apnea)   . Osteoarthritis (arthritis due to wear and tear of joints)   . PONV (postoperative nausea and vomiting)   . Postmenopausal bleeding   . Rosacea   . Sleep apnea    does not use CPAP, patient trying to lose weight  . SVD (spontaneous vaginal delivery)    x 2  . Tachycardia     Past Surgical History:  Procedure Laterality Date  . CHOLECYSTECTOMY  2009  . COLONOSCOPY    . FOOT SURGERY Bilateral    x 2 separate surgery for removal of cysts   . HYSTEROSCOPY W/D&C N/A 03/08/2013   Procedure: DILATATION AND CURETTAGE /HYSTEROSCOPY;  Surgeon: Maeola Sarah. Landry Mellow, MD;  Location: [facility];  Service: Gynecology;  Laterality: N/A;  . LAPAROSCOPIC VAGINAL HYSTERECTOMY WITH SALPINGO OOPHORECTOMY Bilateral 09/07/2017   Procedure: LAPAROSCOPIC ASSISTED VAGINAL  HYSTERECTOMY WITH SALPINGO OOPHORECTOMY;  Surgeon: Christophe Louis, MD;  Location: [facility];  Service: Gynecology;  Laterality: Bilateral;  . WISDOM TOOTH EXTRACTION       Current Outpatient Medications  Medication Sig Dispense Refill  . aspirin 81 MG tablet Take 81 mg by mouth daily.    . Calcium Carb-Cholecalciferol (CALCIUM-VITAMIN D3) 600-400 MG-UNIT TABS Take 1 tablet by mouth 2 (two) times daily.    . candesartan-hydrochlorothiazide (ATACAND HCT) 16-12.5 MG per tablet Take 1 tablet by mouth daily.    Marland Kitchen ibuprofen (ADVIL,MOTRIN) 800 MG tablet Take 1 tablet (800 mg total) by mouth every 8 (eight) hours as needed (mild pain). 30 tablet 0  . metroNIDAZOLE (METROCREAM) 0.75 % cream Apply 1 application topically 2 (two) times daily.  1  . Multiple Vitamin (MULTIVITAMIN WITH MINERALS) TABS Take 1 tablet by mouth daily.    Vladimir Faster Glycol-Propyl Glycol (SYSTANE OP) Place 2-3 drops into both eyes 2 (two) times daily as needed (for dry eyes).     No current facility-administered medications for this visit.     Allergies:   Amoxicillin    Social History:  The patient  reports that  has never smoked. she has never used smokeless tobacco. She reports that she drinks about 2.4 oz of alcohol per  week. She reports that she does not use drugs.   Family History:  The patient's family history includes Diabetes in her brother and mother; Healthy in her daughter and daughter; Heart Problems in her father; Hypertension in her brother and mother.    ROS:  Please see the history of present illness.   Otherwise, review of systems are positive for knee pain.   All other systems are reviewed and negative.    PHYSICAL EXAM: VS:  BP 120/80 (BP Location: Right Arm, Patient Position: Sitting, Cuff Size: Large)   Pulse (!) 108   Ht 5\' 2"  (1.575 m)   Wt 260 lb 9.6 oz (118.2 kg)   SpO2 98%   BMI 47.66 kg/m  , BMI Body mass index is 47.66 kg/m. GEN: Well nourished, well developed, in no acute distress  HEENT:  normal  Neck: no JVD, carotid bruits, or masses Cardiac: RRR; no murmurs, rubs, or gallops,no edema  Respiratory:  clear to auscultation bilaterally, normal work of breathing GI: soft, nontender, nondistended, + BS MS: no deformity or atrophy  Skin: warm and dry, no rash Neuro:  Strength and sensation are intact Psych: euthymic mood, full affect   EKG:   The ekg ordered 09/09/17 demonstrates NSR, RBBB   Recent Labs: 08/31/2017: BUN 11; Creatinine, Ser 0.74; Potassium 4.0; Sodium 139 09/08/2017: Hemoglobin 12.2; Platelets 219   Lipid Panel No results found for: CHOL, TRIG, HDL, CHOLHDL, VLDL, LDLCALC, LDLDIRECT   Other studies Reviewed: Additional studies/ records that were reviewed today with results demonstrating: old ECGs reviewed; RBBB new since 2014.   ASSESSMENT AND PLAN:  1. RBBB/ abnormal ECG: No symptoms.  No further management required.  Indicates some slowing if the electrical system of her heart.  2. Obesity: We spoke about prevention and decreasing cardiac risk going forward.  She will increase exercise and try to lose weight. She can try exercises that are easier on her knees.  Healthy diet will also help.  3. HTN: Controlled.  Continue current meds.    Current medicines are reviewed at length with the patient today.  The patient concerns regarding her medicines were addressed.  The following changes have been made:  No change  Labs/ tests ordered today include:  No orders of the defined types were placed in this encounter.   Recommend 150 minutes/week of aerobic exercise Low fat, low carb, high fiber diet recommended  Disposition:   FU in prn   Signed, Larae Grooms, MD  10/18/2017 9:14 AM    [facility] [address], [place], [place]  [address] Phone: [phone]; Fax: [phone]

## 2017-10-18 NOTE — Patient Instructions (Signed)
Medication Instructions:  Your physician recommends that you continue on your current medications as directed. Please refer to the Current Medication list given to you today.   Labwork: None ordered  Testing/Procedures: None ordered  Follow-Up: Your physician wants you to follow-up with Dr. Varanasi AS NEEDED   Any Other Special Instructions Will Be Listed Below (If Applicable).     If you need a refill on your cardiac medications before your next appointment, please call your pharmacy.   

## 2017-10-24 DIAGNOSIS — M9903 Segmental and somatic dysfunction of lumbar region: Secondary | ICD-10-CM | POA: Diagnosis not present

## 2017-10-24 DIAGNOSIS — M5416 Radiculopathy, lumbar region: Secondary | ICD-10-CM | POA: Diagnosis not present

## 2017-10-24 DIAGNOSIS — M545 Low back pain: Secondary | ICD-10-CM | POA: Diagnosis not present

## 2017-10-24 DIAGNOSIS — M9904 Segmental and somatic dysfunction of sacral region: Secondary | ICD-10-CM | POA: Diagnosis not present

## 2017-10-27 DIAGNOSIS — M545 Low back pain: Secondary | ICD-10-CM | POA: Diagnosis not present

## 2017-10-27 DIAGNOSIS — M9904 Segmental and somatic dysfunction of sacral region: Secondary | ICD-10-CM | POA: Diagnosis not present

## 2017-10-27 DIAGNOSIS — M5416 Radiculopathy, lumbar region: Secondary | ICD-10-CM | POA: Diagnosis not present

## 2017-10-27 DIAGNOSIS — M9903 Segmental and somatic dysfunction of lumbar region: Secondary | ICD-10-CM | POA: Diagnosis not present

## 2017-11-02 DIAGNOSIS — M5416 Radiculopathy, lumbar region: Secondary | ICD-10-CM | POA: Diagnosis not present

## 2017-11-02 DIAGNOSIS — M545 Low back pain: Secondary | ICD-10-CM | POA: Diagnosis not present

## 2017-11-02 DIAGNOSIS — M9904 Segmental and somatic dysfunction of sacral region: Secondary | ICD-10-CM | POA: Diagnosis not present

## 2017-11-02 DIAGNOSIS — M9903 Segmental and somatic dysfunction of lumbar region: Secondary | ICD-10-CM | POA: Diagnosis not present

## 2017-11-09 DIAGNOSIS — M9903 Segmental and somatic dysfunction of lumbar region: Secondary | ICD-10-CM | POA: Diagnosis not present

## 2017-11-09 DIAGNOSIS — M9904 Segmental and somatic dysfunction of sacral region: Secondary | ICD-10-CM | POA: Diagnosis not present

## 2017-11-09 DIAGNOSIS — M5416 Radiculopathy, lumbar region: Secondary | ICD-10-CM | POA: Diagnosis not present

## 2017-11-09 DIAGNOSIS — M545 Low back pain: Secondary | ICD-10-CM | POA: Diagnosis not present

## 2017-11-10 DIAGNOSIS — C441192 Basal cell carcinoma of skin of left lower eyelid, including canthus: Secondary | ICD-10-CM | POA: Diagnosis not present

## 2017-11-16 DIAGNOSIS — M9905 Segmental and somatic dysfunction of pelvic region: Secondary | ICD-10-CM | POA: Diagnosis not present

## 2017-11-16 DIAGNOSIS — M9903 Segmental and somatic dysfunction of lumbar region: Secondary | ICD-10-CM | POA: Diagnosis not present

## 2017-11-16 DIAGNOSIS — M545 Low back pain: Secondary | ICD-10-CM | POA: Diagnosis not present

## 2017-11-16 DIAGNOSIS — M9904 Segmental and somatic dysfunction of sacral region: Secondary | ICD-10-CM | POA: Diagnosis not present

## 2018-02-24 DIAGNOSIS — Z6841 Body Mass Index (BMI) 40.0 and over, adult: Secondary | ICD-10-CM | POA: Diagnosis not present

## 2018-02-24 DIAGNOSIS — I10 Essential (primary) hypertension: Secondary | ICD-10-CM | POA: Diagnosis not present

## 2018-06-09 ENCOUNTER — Other Ambulatory Visit: Payer: Self-pay | Admitting: Family Medicine

## 2018-06-09 DIAGNOSIS — Z1231 Encounter for screening mammogram for malignant neoplasm of breast: Secondary | ICD-10-CM

## 2018-07-21 ENCOUNTER — Ambulatory Visit
Admission: RE | Admit: 2018-07-21 | Discharge: 2018-07-21 | Disposition: A | Discharge status: Home / Self Care | Payer: BLUE CROSS/BLUE SHIELD | Source: Ambulatory Visit | Attending: Family Medicine | Admitting: Family Medicine

## 2018-07-21 DIAGNOSIS — Z1231 Encounter for screening mammogram for malignant neoplasm of breast: Secondary | ICD-10-CM

## 2018-07-24 ENCOUNTER — Other Ambulatory Visit: Payer: Self-pay | Admitting: Family Medicine

## 2018-07-24 DIAGNOSIS — R928 Other abnormal and inconclusive findings on diagnostic imaging of breast: Secondary | ICD-10-CM

## 2018-07-26 ENCOUNTER — Other Ambulatory Visit: Payer: Self-pay | Admitting: Family Medicine

## 2018-07-26 ENCOUNTER — Ambulatory Visit
Admission: RE | Admit: 2018-07-26 | Discharge: 2018-07-26 | Disposition: A | Discharge status: Home / Self Care | Payer: BLUE CROSS/BLUE SHIELD | Source: Ambulatory Visit | Attending: Family Medicine | Admitting: Family Medicine

## 2018-07-26 DIAGNOSIS — R928 Other abnormal and inconclusive findings on diagnostic imaging of breast: Secondary | ICD-10-CM

## 2018-07-26 DIAGNOSIS — N6489 Other specified disorders of breast: Secondary | ICD-10-CM | POA: Diagnosis not present

## 2018-07-26 DIAGNOSIS — N63 Unspecified lump in unspecified breast: Secondary | ICD-10-CM

## 2018-09-18 DIAGNOSIS — M170 Bilateral primary osteoarthritis of knee: Secondary | ICD-10-CM | POA: Diagnosis not present

## 2018-09-18 DIAGNOSIS — Z0000 Encounter for general adult medical examination without abnormal findings: Secondary | ICD-10-CM | POA: Diagnosis not present

## 2018-09-18 DIAGNOSIS — I10 Essential (primary) hypertension: Secondary | ICD-10-CM | POA: Diagnosis not present

## 2018-09-18 DIAGNOSIS — D508 Other iron deficiency anemias: Secondary | ICD-10-CM | POA: Diagnosis not present

## 2018-10-31 ENCOUNTER — Other Ambulatory Visit: Payer: Self-pay | Admitting: Family Medicine

## 2018-10-31 DIAGNOSIS — E7800 Pure hypercholesterolemia, unspecified: Secondary | ICD-10-CM

## 2018-12-25 ENCOUNTER — Other Ambulatory Visit: Payer: BLUE CROSS/BLUE SHIELD

## 2019-01-01 ENCOUNTER — Other Ambulatory Visit: Payer: BLUE CROSS/BLUE SHIELD

## 2019-01-16 ENCOUNTER — Other Ambulatory Visit: Payer: Self-pay

## 2019-01-16 ENCOUNTER — Ambulatory Visit
Admission: RE | Admit: 2019-01-16 | Discharge: 2019-01-16 | Disposition: A | Discharge status: Home / Self Care | Payer: No Typology Code available for payment source | Source: Ambulatory Visit | Attending: Family Medicine | Admitting: Family Medicine

## 2019-01-16 DIAGNOSIS — E7800 Pure hypercholesterolemia, unspecified: Secondary | ICD-10-CM

## 2019-01-16 DIAGNOSIS — D72829 Elevated white blood cell count, unspecified: Secondary | ICD-10-CM | POA: Diagnosis not present

## 2019-01-26 ENCOUNTER — Other Ambulatory Visit: Payer: Self-pay

## 2019-01-26 ENCOUNTER — Ambulatory Visit
Admission: RE | Admit: 2019-01-26 | Discharge: 2019-01-26 | Disposition: A | Discharge status: Home / Self Care | Payer: BLUE CROSS/BLUE SHIELD | Source: Ambulatory Visit | Attending: Family Medicine | Admitting: Family Medicine

## 2019-01-26 DIAGNOSIS — N63 Unspecified lump in unspecified breast: Secondary | ICD-10-CM

## 2019-01-26 DIAGNOSIS — N6002 Solitary cyst of left breast: Secondary | ICD-10-CM | POA: Diagnosis not present

## 2019-01-26 DIAGNOSIS — R928 Other abnormal and inconclusive findings on diagnostic imaging of breast: Secondary | ICD-10-CM | POA: Diagnosis not present

## 2019-04-24 DIAGNOSIS — L570 Actinic keratosis: Secondary | ICD-10-CM | POA: Diagnosis not present

## 2019-04-24 DIAGNOSIS — L821 Other seborrheic keratosis: Secondary | ICD-10-CM | POA: Diagnosis not present

## 2019-04-24 DIAGNOSIS — D225 Melanocytic nevi of trunk: Secondary | ICD-10-CM | POA: Diagnosis not present

## 2019-04-24 DIAGNOSIS — Z85828 Personal history of other malignant neoplasm of skin: Secondary | ICD-10-CM | POA: Diagnosis not present

## 2019-04-24 DIAGNOSIS — E7800 Pure hypercholesterolemia, unspecified: Secondary | ICD-10-CM | POA: Diagnosis not present

## 2019-04-24 DIAGNOSIS — D1801 Hemangioma of skin and subcutaneous tissue: Secondary | ICD-10-CM | POA: Diagnosis not present

## 2019-10-15 DIAGNOSIS — I10 Essential (primary) hypertension: Secondary | ICD-10-CM | POA: Diagnosis not present

## 2019-10-15 DIAGNOSIS — E7800 Pure hypercholesterolemia, unspecified: Secondary | ICD-10-CM | POA: Diagnosis not present

## 2019-10-15 DIAGNOSIS — M170 Bilateral primary osteoarthritis of knee: Secondary | ICD-10-CM | POA: Diagnosis not present

## 2019-10-15 DIAGNOSIS — Z5181 Encounter for therapeutic drug level monitoring: Secondary | ICD-10-CM | POA: Diagnosis not present

## 2019-10-15 DIAGNOSIS — Z0000 Encounter for general adult medical examination without abnormal findings: Secondary | ICD-10-CM | POA: Diagnosis not present

## 2019-10-17 IMAGING — MG DIGITAL DIAGNOSTIC UNILATERAL LEFT MAMMOGRAM WITH TOMO AND CAD
4 series · 4 of 12 positions shown · non-contrast
Comparison: Previous exams including recent screening mammogram
dated 07/21/2018.

CLINICAL DATA: Patient returns today to evaluate a possible LEFT
breast mass identified on recent screening mammogram.

EXAM:
DIGITAL DIAGNOSTIC LEFT MAMMOGRAM WITH CAD AND TOMO
ULTRASOUND LEFT BREAST

[L MLO synth-2D]
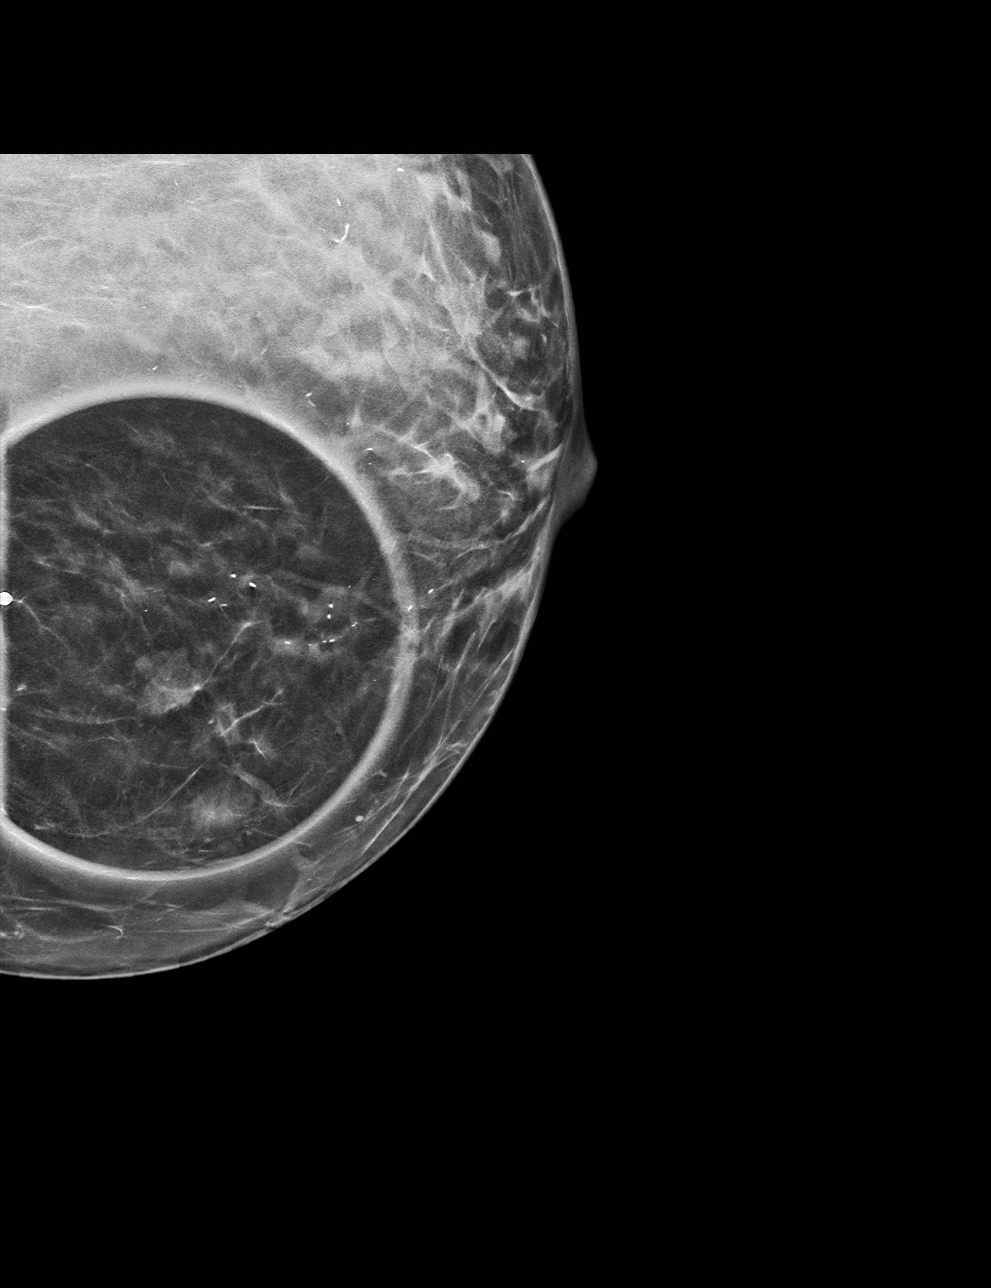

[L CC synth-2D]
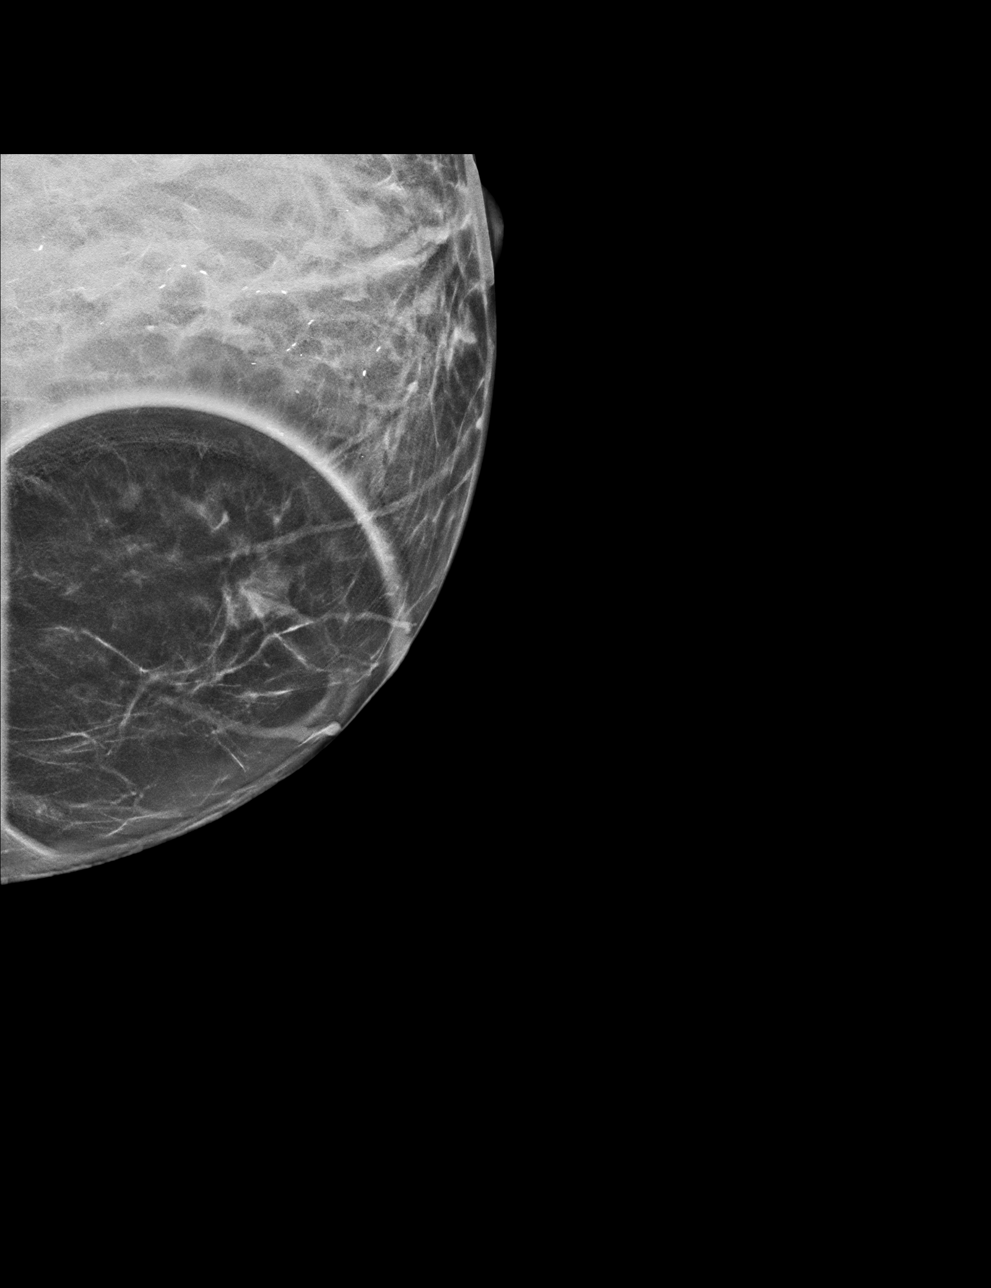

[L MLO tomo · tomo slice 30/59.0]
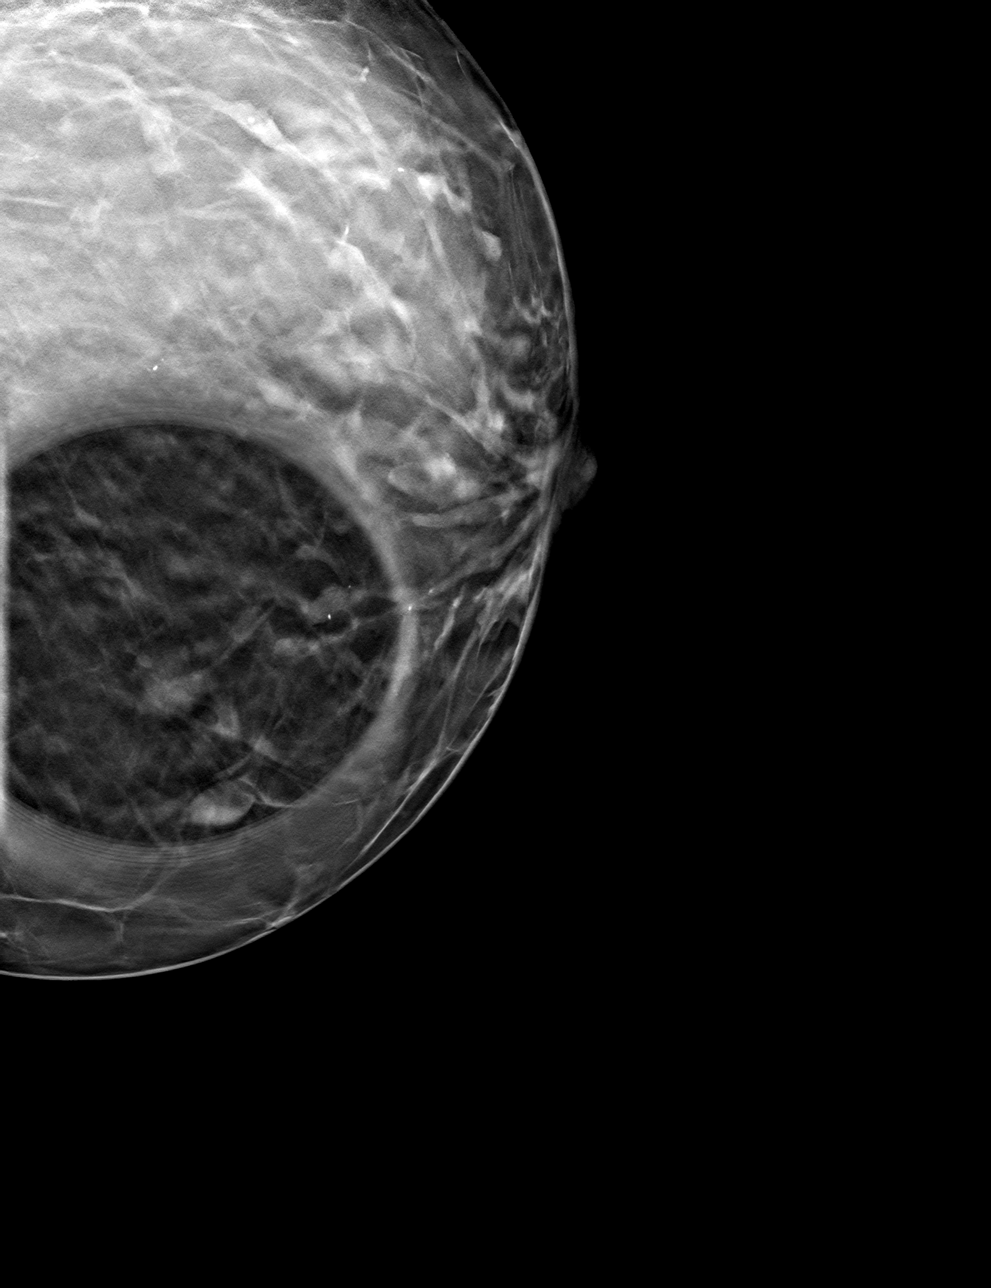

[L CC tomo · tomo slice 25/49.0]
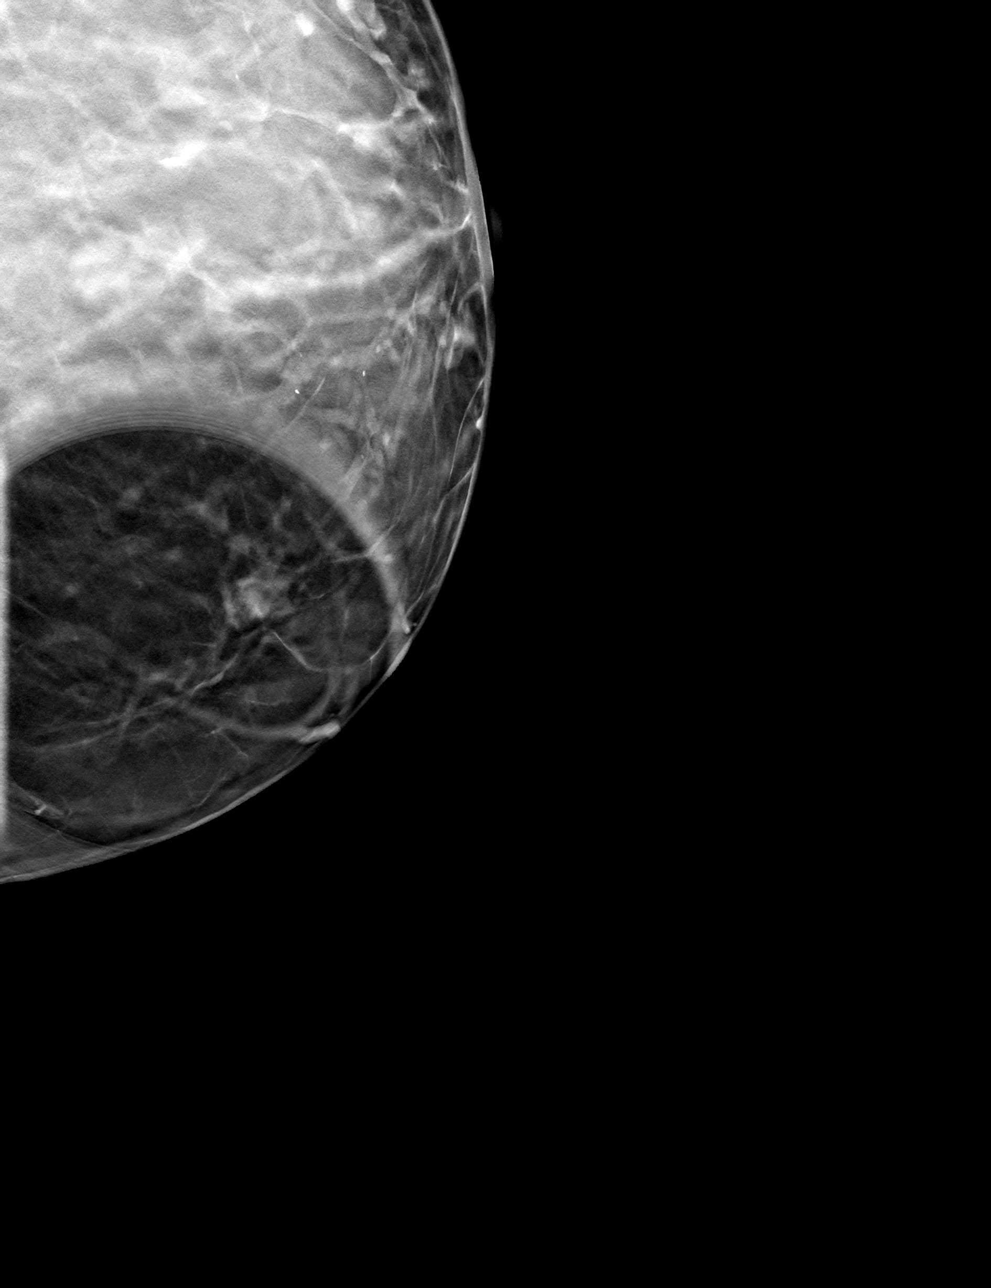

[4 of 12 positions shown; findings below may reference images not displayed]

ACR Breast Density Category b: There are scattered areas of
fibroglandular density.
FINDINGS: An oval circumscribed mass is confirmed within the lower inner
quadrant of the LEFT breast, measuring approximately 1.2 cm.

There is an additional mass in the inner LEFT breast, versus 2
abutting masses (located in a more central location than the mass
recommended for diagnostic evaluation today), stable compared to
multiple prior studies indicating benignity.

Mammographic images were processed with CAD.

Targeted ultrasound is performed, showing a probably benign cluster
of microcysts in the LEFT breast at the 7 o'clock axis, 4 cm from
the nipple, measuring 1.3 x 0.7 x 0.8 cm. This most likely
corresponds to the stable mass within the inner LEFT breast (more
central of the 2 adjacent masses in the inner LEFT breast).

There is an additional oval circumscribed simple cyst in the LEFT
breast at the 9 o'clock axis, 4 cm from the nipple, measuring 10 x 4
x 9 mm, likely corresponding to the new mass seen on recent
screening mammogram.
IMPRESSION: 1. Probably benign cluster of microcysts in the LEFT breast at the 7
o'clock axis, 4 cm from the nipple, measuring 1.3 x 0.7 x 0.8 cm,
most likely corresponding to the mammographically stable mass within
the inner LEFT breast (the more central of the 2 adjacent masses in
the inner LEFT breast). Recommend follow-up diagnostic mammogram and
ultrasound to ensure stability of today's appearance.
2. Simple cyst within the LEFT breast at the 9 o'clock axis, 4 cm
from the nipple, measuring 10 mm, likely corresponding to the new
mass seen on recent screening mammogram. Also recommend attention to
this mass on the follow-up diagnostic exam recommended above.

RECOMMENDATION:
LEFT breast diagnostic mammogram and ultrasound in 6 months.

I have discussed the findings and recommendations with the patient.
Results were also provided in writing at the conclusion of the
visit. If applicable, a reminder letter will be sent to the patient
regarding the next appointment.

BI-RADS CATEGORY  3: Probably benign.

## 2020-01-08 DIAGNOSIS — H43813 Vitreous degeneration, bilateral: Secondary | ICD-10-CM | POA: Diagnosis not present

## 2020-01-08 DIAGNOSIS — H31092 Other chorioretinal scars, left eye: Secondary | ICD-10-CM | POA: Diagnosis not present

## 2020-01-08 DIAGNOSIS — H33322 Round hole, left eye: Secondary | ICD-10-CM | POA: Diagnosis not present

## 2020-01-15 DIAGNOSIS — H33322 Round hole, left eye: Secondary | ICD-10-CM | POA: Diagnosis not present

## 2020-02-12 DIAGNOSIS — H31092 Other chorioretinal scars, left eye: Secondary | ICD-10-CM | POA: Diagnosis not present

## 2020-04-18 IMAGING — MG DIGITAL DIAGNOSTIC UNILATERAL LEFT MAMMOGRAM WITH TOMO AND CAD
4 series · 4 of 12 positions shown · non-contrast
Comparison: Previous exam(s).

CLINICAL DATA: Follow-up probably benign complicated cyst in the 7
o'clock position of the left breast and oval, circumscribed mass in
the medial left breast with a probable corresponding simple cyst in
the 9 o'clock position at previous ultrasound.

EXAM:
DIGITAL DIAGNOSTIC LEFT MAMMOGRAM WITH CAD AND TOMO
ULTRASOUND LEFT BREAST

[L MLO synth-2D]
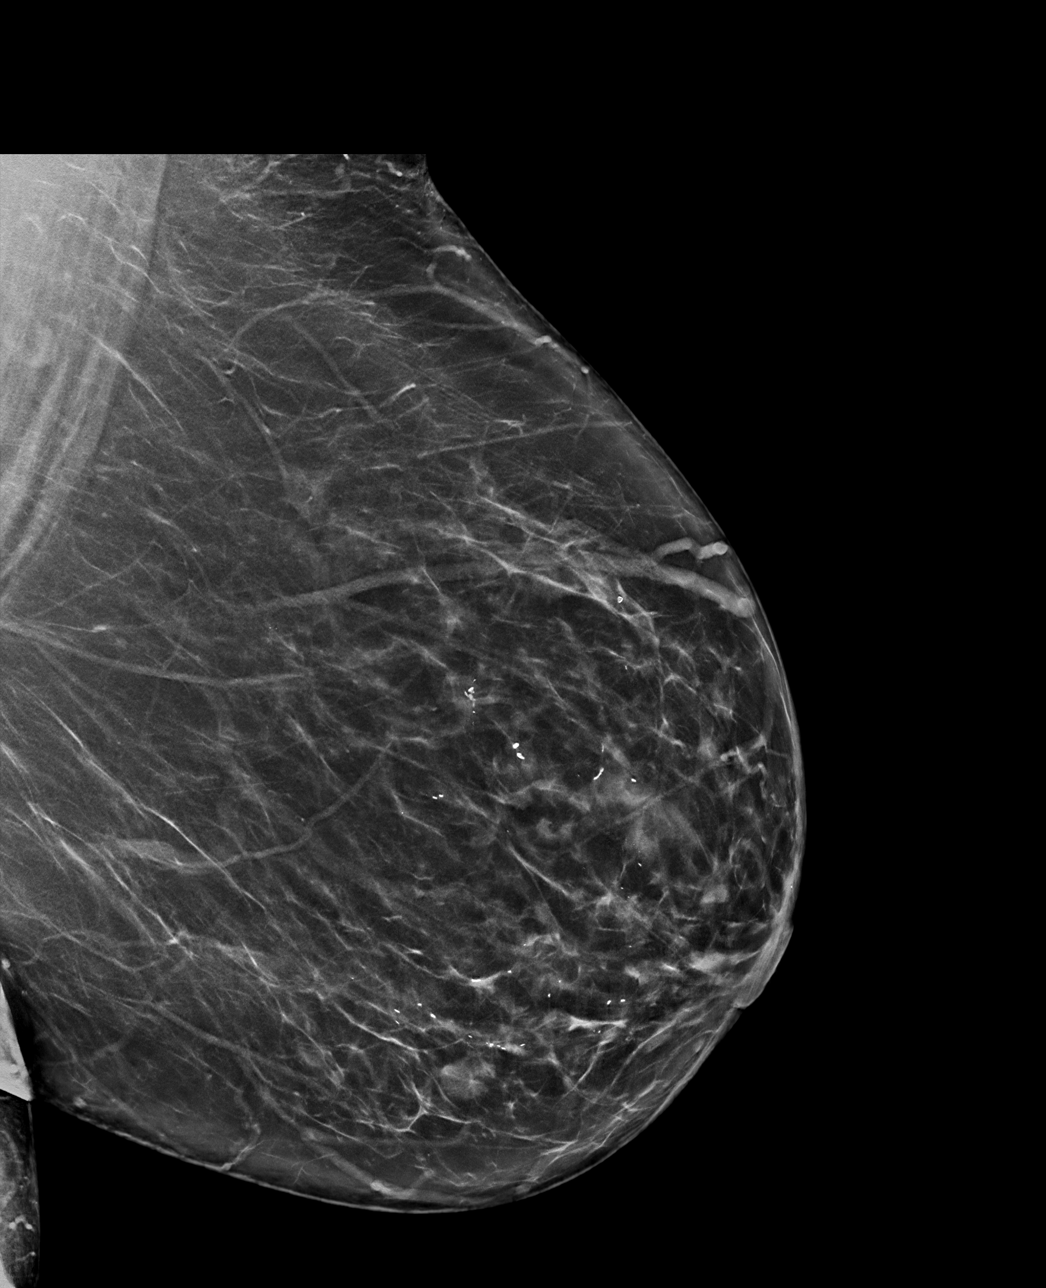

[L CC synth-2D]
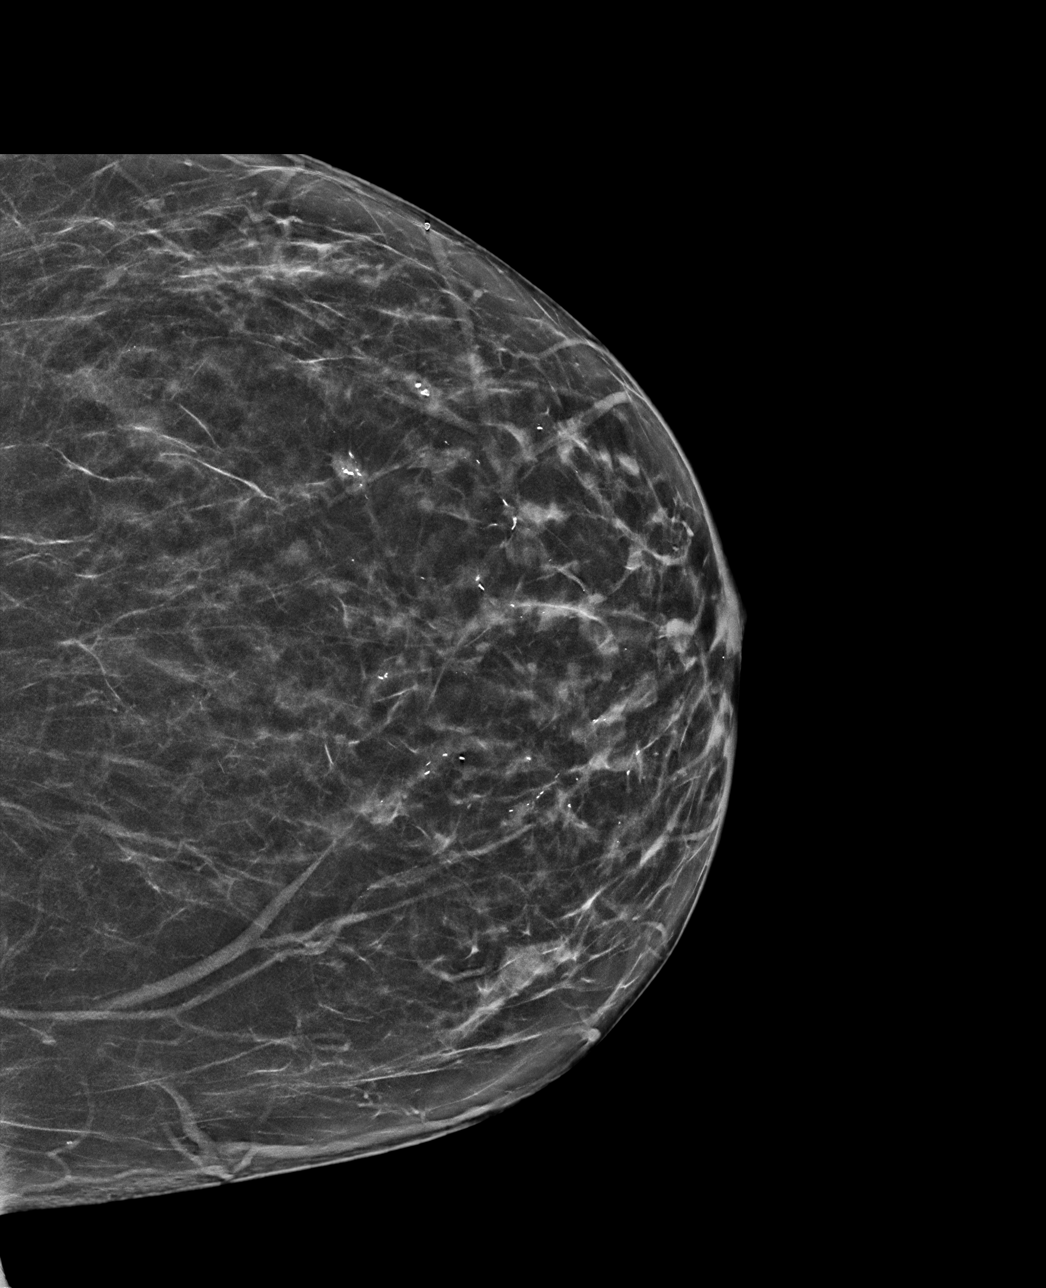

[L CC tomo · tomo slice 35/70.0]
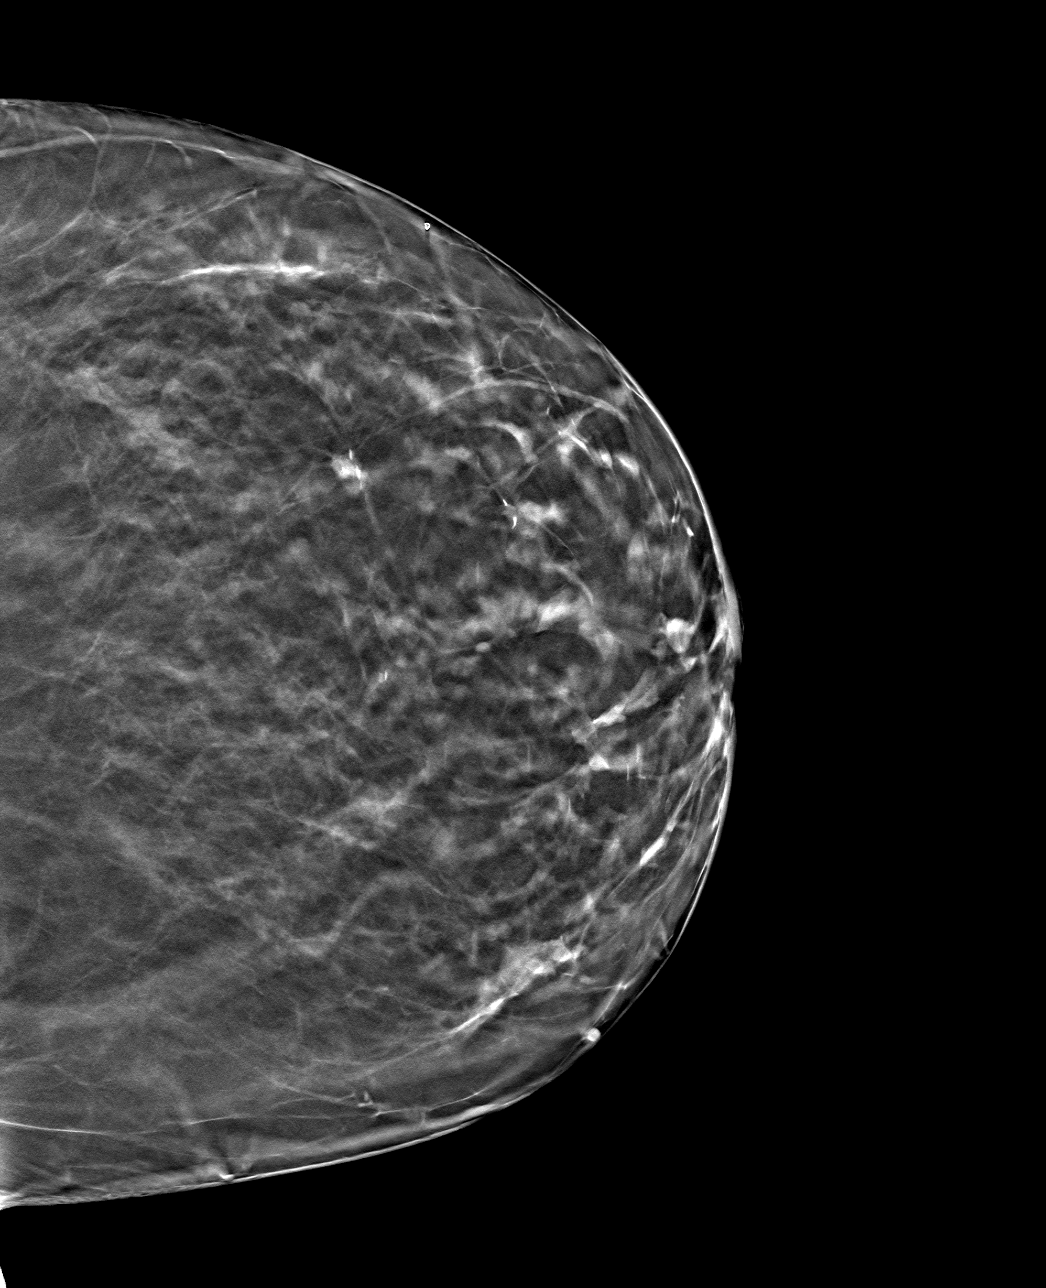

[L MLO tomo · tomo slice 45/89.0]
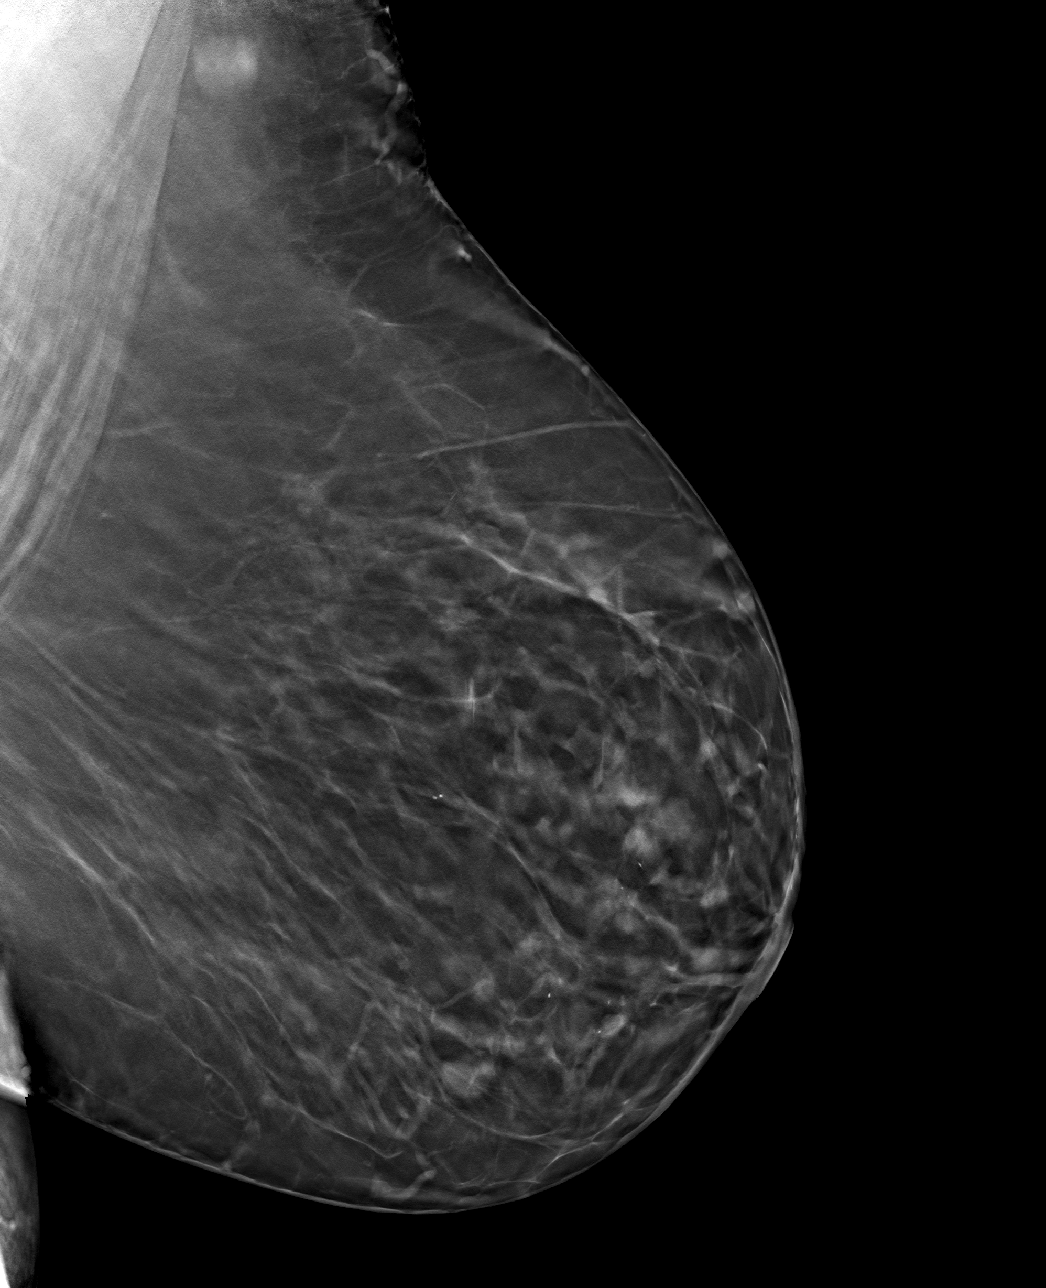

[4 of 12 positions shown; findings below may reference images not displayed]

ACR Breast Density Category b: There are scattered areas of
fibroglandular density.
FINDINGS: A previously demonstrated mammographically stable rounded,
circumscribed mass in the medial left breast is significantly
smaller. A previously demonstrated oval, circumscribed mass in the
medial left breast is unchanged. No interval findings suspicious for
malignancy.

Mammographic images were processed with CAD.

Targeted ultrasound is performed, showing a 6 x 5 x 4 mm cyst
containing small focal echogenic areas in the 7 o'clock position of
the left breast, 4 cm from the nipple. This measured 13 x 8 x 7 mm
on 07/26/2018 and corresponds to the mammographically shrinking
mass.
IMPRESSION: 1. Interval decrease in size of a benign complicated cyst in the 7
o'clock position of the left breast.
2. Mammographically stable simple cyst in the 9 o'clock position of
the left breast.
3. No evidence of malignancy.

RECOMMENDATION:
Bilateral screening mammogram in 6 months when due. That will be 1
year since mammographic evaluation of the right breast.

I have discussed the findings and recommendations with the patient.
Results were also provided in writing at the conclusion of the
visit. If applicable, a reminder letter will be sent to the patient
regarding the next appointment.

BI-RADS CATEGORY  2: Benign.

## 2020-04-21 DIAGNOSIS — L237 Allergic contact dermatitis due to plants, except food: Secondary | ICD-10-CM | POA: Diagnosis not present

## 2020-04-25 DIAGNOSIS — L821 Other seborrheic keratosis: Secondary | ICD-10-CM | POA: Diagnosis not present

## 2020-04-25 DIAGNOSIS — L720 Epidermal cyst: Secondary | ICD-10-CM | POA: Diagnosis not present

## 2020-04-25 DIAGNOSIS — D1801 Hemangioma of skin and subcutaneous tissue: Secondary | ICD-10-CM | POA: Diagnosis not present

## 2020-04-25 DIAGNOSIS — L570 Actinic keratosis: Secondary | ICD-10-CM | POA: Diagnosis not present

## 2020-04-25 DIAGNOSIS — Z85828 Personal history of other malignant neoplasm of skin: Secondary | ICD-10-CM | POA: Diagnosis not present

## 2020-07-08 ENCOUNTER — Other Ambulatory Visit: Payer: Self-pay | Admitting: Family Medicine

## 2020-07-08 DIAGNOSIS — Z1231 Encounter for screening mammogram for malignant neoplasm of breast: Secondary | ICD-10-CM

## 2020-08-22 ENCOUNTER — Other Ambulatory Visit: Payer: Self-pay

## 2020-08-22 ENCOUNTER — Ambulatory Visit
Admission: RE | Admit: 2020-08-22 | Discharge: 2020-08-22 | Disposition: A | Discharge status: Home / Self Care | Payer: 59 | Source: Ambulatory Visit | Attending: Family Medicine | Admitting: Family Medicine

## 2020-08-22 DIAGNOSIS — Z1231 Encounter for screening mammogram for malignant neoplasm of breast: Secondary | ICD-10-CM

## 2020-11-04 DIAGNOSIS — Z23 Encounter for immunization: Secondary | ICD-10-CM | POA: Diagnosis not present

## 2020-11-04 DIAGNOSIS — I10 Essential (primary) hypertension: Secondary | ICD-10-CM | POA: Diagnosis not present

## 2020-11-04 DIAGNOSIS — H6122 Impacted cerumen, left ear: Secondary | ICD-10-CM | POA: Diagnosis not present

## 2020-11-04 DIAGNOSIS — E7800 Pure hypercholesterolemia, unspecified: Secondary | ICD-10-CM | POA: Diagnosis not present

## 2020-11-04 DIAGNOSIS — Z6841 Body Mass Index (BMI) 40.0 and over, adult: Secondary | ICD-10-CM | POA: Diagnosis not present

## 2020-11-04 DIAGNOSIS — Z0000 Encounter for general adult medical examination without abnormal findings: Secondary | ICD-10-CM | POA: Diagnosis not present

## 2020-11-04 DIAGNOSIS — R739 Hyperglycemia, unspecified: Secondary | ICD-10-CM | POA: Diagnosis not present

## 2021-03-04 DIAGNOSIS — H33322 Round hole, left eye: Secondary | ICD-10-CM | POA: Diagnosis not present

## 2021-04-27 DIAGNOSIS — D225 Melanocytic nevi of trunk: Secondary | ICD-10-CM | POA: Diagnosis not present

## 2021-04-27 DIAGNOSIS — L821 Other seborrheic keratosis: Secondary | ICD-10-CM | POA: Diagnosis not present

## 2021-04-27 DIAGNOSIS — L720 Epidermal cyst: Secondary | ICD-10-CM | POA: Diagnosis not present

## 2021-04-27 DIAGNOSIS — L438 Other lichen planus: Secondary | ICD-10-CM | POA: Diagnosis not present

## 2021-04-27 DIAGNOSIS — L918 Other hypertrophic disorders of the skin: Secondary | ICD-10-CM | POA: Diagnosis not present

## 2021-04-27 DIAGNOSIS — D1801 Hemangioma of skin and subcutaneous tissue: Secondary | ICD-10-CM | POA: Diagnosis not present

## 2021-04-27 DIAGNOSIS — L814 Other melanin hyperpigmentation: Secondary | ICD-10-CM | POA: Diagnosis not present

## 2021-04-27 DIAGNOSIS — L570 Actinic keratosis: Secondary | ICD-10-CM | POA: Diagnosis not present

## 2021-04-27 DIAGNOSIS — Z85828 Personal history of other malignant neoplasm of skin: Secondary | ICD-10-CM | POA: Diagnosis not present

## 2022-03-08 DIAGNOSIS — Z0000 Encounter for general adult medical examination without abnormal findings: Secondary | ICD-10-CM | POA: Diagnosis not present

## 2022-03-08 DIAGNOSIS — R7303 Prediabetes: Secondary | ICD-10-CM | POA: Diagnosis not present

## 2022-03-08 DIAGNOSIS — I2510 Atherosclerotic heart disease of native coronary artery without angina pectoris: Secondary | ICD-10-CM | POA: Diagnosis not present

## 2022-03-08 DIAGNOSIS — G4733 Obstructive sleep apnea (adult) (pediatric): Secondary | ICD-10-CM | POA: Diagnosis not present

## 2022-03-08 DIAGNOSIS — R5383 Other fatigue: Secondary | ICD-10-CM | POA: Diagnosis not present

## 2022-03-08 DIAGNOSIS — I10 Essential (primary) hypertension: Secondary | ICD-10-CM | POA: Diagnosis not present

## 2022-03-08 DIAGNOSIS — E7800 Pure hypercholesterolemia, unspecified: Secondary | ICD-10-CM | POA: Diagnosis not present

## 2022-04-16 DEATH — deceased
